# Patient Record
Sex: Female | Born: 1965 | Race: Black or African American | Hispanic: No | Marital: Single | Smoking: Current every day smoker
Health system: Southern US, Community
[De-identification: ages and names within clinical notes are randomized; demographics above are authoritative.]

## PROBLEM LIST (undated history)

## (undated) DIAGNOSIS — I10 Essential (primary) hypertension: Secondary | ICD-10-CM

## (undated) DIAGNOSIS — A64 Unspecified sexually transmitted disease: Secondary | ICD-10-CM

## (undated) DIAGNOSIS — B009 Herpesviral infection, unspecified: Secondary | ICD-10-CM

## (undated) HISTORY — DX: Herpesviral infection, unspecified: B00.9

## (undated) HISTORY — DX: Unspecified sexually transmitted disease: A64

## (undated) HISTORY — PX: FINGER SURGERY: SHX640

## (undated) HISTORY — DX: Essential (primary) hypertension: I10

---

## 1998-10-02 ENCOUNTER — Other Ambulatory Visit: Admission: RE | Admit: 1998-10-02 | Discharge: 1998-10-02 | Payer: Self-pay | Admitting: Obstetrics & Gynecology

## 2000-01-26 ENCOUNTER — Other Ambulatory Visit: Admission: RE | Admit: 2000-01-26 | Discharge: 2000-01-26 | Payer: Self-pay | Admitting: Obstetrics and Gynecology

## 2001-12-04 ENCOUNTER — Other Ambulatory Visit: Admission: RE | Admit: 2001-12-04 | Discharge: 2001-12-04 | Payer: Self-pay | Admitting: Obstetrics and Gynecology

## 2003-06-20 ENCOUNTER — Other Ambulatory Visit: Admission: RE | Admit: 2003-06-20 | Discharge: 2003-06-20 | Payer: Self-pay | Admitting: Gynecology

## 2004-09-09 ENCOUNTER — Other Ambulatory Visit: Admission: RE | Admit: 2004-09-09 | Discharge: 2004-09-09 | Payer: Self-pay | Admitting: Family Medicine

## 2007-04-20 ENCOUNTER — Other Ambulatory Visit: Admission: RE | Admit: 2007-04-20 | Discharge: 2007-04-20 | Payer: Self-pay | Admitting: Family Medicine

## 2008-10-30 ENCOUNTER — Other Ambulatory Visit: Admission: RE | Admit: 2008-10-30 | Discharge: 2008-10-30 | Payer: Self-pay | Admitting: Family Medicine

## 2010-07-28 ENCOUNTER — Other Ambulatory Visit
Admission: RE | Admit: 2010-07-28 | Discharge: 2010-07-28 | Payer: Self-pay | Source: Home / Self Care | Admitting: Family Medicine

## 2010-07-28 ENCOUNTER — Other Ambulatory Visit: Payer: Self-pay

## 2010-09-04 ENCOUNTER — Emergency Department (HOSPITAL_COMMUNITY): Payer: PRIVATE HEALTH INSURANCE

## 2010-09-04 ENCOUNTER — Observation Stay (HOSPITAL_COMMUNITY)
Admission: EM | Admit: 2010-09-04 | Discharge: 2010-09-04 | DRG: 514 | Disposition: A | Discharge status: Home / Self Care | Payer: PRIVATE HEALTH INSURANCE | Attending: Orthopedic Surgery | Admitting: Orthopedic Surgery

## 2010-09-04 DIAGNOSIS — I10 Essential (primary) hypertension: Secondary | ICD-10-CM | POA: Insufficient documentation

## 2010-09-04 DIAGNOSIS — W28XXXA Contact with powered lawn mower, initial encounter: Secondary | ICD-10-CM | POA: Insufficient documentation

## 2010-09-04 DIAGNOSIS — IMO0002 Reserved for concepts with insufficient information to code with codable children: Secondary | ICD-10-CM | POA: Insufficient documentation

## 2010-09-04 DIAGNOSIS — S62639B Displaced fracture of distal phalanx of unspecified finger, initial encounter for open fracture: Principal | ICD-10-CM | POA: Insufficient documentation

## 2010-09-24 NOTE — H&P (Signed)
Rebecca, Oconnor                 ACCOUNT NO.:  [id number]  MEDICAL RECORD NO.:  [id number]           PATIENT TYPE:  I  LOCATION:  2550                         FACILITY:  [facility]  PHYSICIAN:  Madelynn Done, MD  DATE OF BIRTH:  1965-09-12  DATE OF ADMISSION:  09/04/2010 DATE OF DISCHARGE:  09/04/2010                             HISTORY & PHYSICAL   REASON FOR ADMISSION:  Right long and ring finger open distal phalangeal fractures from a lawn mower injury.  BRIEF HISTORY:  Rebecca Oconnor is a 44-year right-hand-dominant female who was at home using a lawn mower, shook her hand beneath the lawn mower and got her right long and ring fingers cut on the blade.  She presented to the emergency department with obvious deformity and injury to the right long and ring fingers.  No previous injury to the hand.  She is not complaining of anything other than pain to the hand.  A digital block had been performed prior to my evaluation. PAST MEDICAL HISTORY:  Hypertension.  ALLERGIES:  No known drug allergies.  SOCIAL HISTORY:  She is a smoker.  She works here in town.  She lives over near [address]  REVIEW OF SYSTEMS:  No recent hospitalizations or illnesses.  PHYSICAL EXAMINATION:  GENERAL:  She is a healthy-appearing female. VITAL SIGNS:  Temperature 98.5, blood pressure 133/78, heart rate of 72, and respirations 18. NEUROLOGIC:  She has good hand coordination of the left hand.  Normal mood.  She is alert and oriented to person, place, and time, in no acute distress. HEENT:  Pupils are equal and round.  Sclerae are white.  Head atraumatic. NECK:  Soft and nontender. CHEST:  Equal chest excursion.  No audible wheezing. CARDIOVASCULAR:  Regular rate. ABDOMEN:  Soft, nontender, and nondistended. EXTREMITIES:  No injuries to the left upper extremity or bilateral lower extremities.  On examination of right upper extremity, the patient does have an open comminuted distal phalanx  fracture with loss of large portion of the nail bed and underlying distal phalanx.  Most of the wound is over the dorsal aspect of the finger.  She also has soft tissue lacerations to the ring finger, but no full-thickness injury or open bone fragments exposed.  FDP and FDS are both in continuity to the long and ring.  She is able to make the okay sign across the fingertips, extend the thumb, and extend the digits.  Her fingertips are warm and skin flaps to both the long and ring fingers look good.  She has good wrist flexion and extension as well as forearm rotation.  Radiographs do show a highly comminuted distal phalanx fracture to the right long finger as well as soft tissue injury to the right ring finger.  IMPRESSION:  Right long and ring finger lawn mower injuries with open distal phalangeal fractures.  PLAN:  Today, the findings were reviewed with the patient.  We talked about the injuries to both the long and ring fingers and after talking with her in detail I recommended operative intervention to help debrideand fix the distal phalangeal injuries,  likely revision and amputation to the long finger and soft tissue repair to the ring finger.  We talked about the risks of surgery to include, but not limited to bleeding, infection, damage to nearby nerves, arteries, or tendons, loss of motion of the elbow and wrist, loss of motion of the digits, and need for further surgical intervention.  All questions were answered and encouraged the patient today.  The patient will taken to the operating room shortly, preoperative antibiotics.  She will be able to go home following the operation, continue to follow as an outpatient.     Madelynn Done, MD     FWO/MEDQ  D:  09/04/2010  T:  09/05/2010  Job:  [id number]  Electronically Signed by Bradly Bienenstock IV MD on 09/24/2010 12:39:50 PM

## 2010-09-24 NOTE — Op Note (Signed)
Rebecca Oconnor, Rebecca Oconnor                 ACCOUNT NO.:  [id number]  MEDICAL RECORD NO.:  [id number]           PATIENT TYPE:  I  LOCATION:  2550                         FACILITY:  [facility]  PHYSICIAN:  Madelynn Done, MD  DATE OF BIRTH:  1965-09-27  DATE OF PROCEDURE:  09/04/2010 DATE OF DISCHARGE:  09/04/2010                              OPERATIVE REPORT   PREOPERATIVE DIAGNOSES: 1. Right ring finger open distal phalanx fracture. 2. Right long finger near distal amputation with comminuted fracture     of the right distal phalanx.  POSTOPERATIVE DIAGNOSES: 1. Right ring finger open distal phalanx fracture. 2. Right long finger near distal amputation with comminuted fracture     of the right distal phalanx.  ATTENDING PHYSICIAN:  Dr. Merlyn Albert, who scrubbed and was present for the entire procedure.  ASSISTANT SURGEON:  None.  ANESTHESIA:  General via LMA.  TOURNIQUET TIME:  Less than 30 minutes at 250 mmHg.  SURGICAL PROCEDURES: 1. Right ring finger open treatment of distal phalanx fracture without     internal fixation. 2. Open debridement of skin, subcutaneous tissue, and bone associated     with open fracture, right ring finger. 3. Repair of right ring finger nail bed. 4. Right long finger open debridement of skin, subcutaneous tissue,     and bone associated with open fracture. 5. Right long finger revision amputation with neurectomies and     advancement flap closure.  SURGICAL INDICATIONS:  Rebecca Oconnor is a 45 year old right-hand dominant female, who placed her hand unfortunately under a running lawn mower. The patient sustained comminuted injuries to the right long and ring fingers.  The patient was seen and evaluated in the emergency department and given the degree of her injury, recommend that she undergo the above procedure.  Risks, benefits, and alternative were discussed in detail with the patient and signed informed consent was obtained.  Risks include but not limited  to bleeding, infection, damage nearby nerves, arteries, tendons, loss of motion of wrist and digits, and need for further surgical intervention.  DESCRIPTION OF PROCEDURE:  The patient was properly identified in the preoperative holding area and marked with permanent marker made on the right long and ring finger just to indicate the correct operative site. The patient was then brought back to the operating room, placed supine on the anesthesia room table where the general anesthetic was administered.  The patient received preoperative antibiotics prior to skin incisions.  The patient tolerated induction of anesthesia.  Well- padded tourniquet was then placed on the right brachium and sealed with 1000 drape.  The right upper extremity was prepped and draped in normal sterile fashion.  Time-out was called, correct site was identified, and procedure was then begun.  Attention was then turned to the right ring finger where excisional debridement of the open fractures was then carried out.  Sharp dissection was used with a skin knife, rongeur, and curette to debride the open fracture site.  Several loose pieces of bone were then excised.  The patient did have a comminuted fracture of the ring finger, distal phalanx, but did not  necessitate internal fixation. Open treatment was carried out, but without internal fixation.  Once this was carried out, the wound was then thoroughly irrigated.  The nail bed was then repaired at the distal tuft with a 5-0 simple chromic sutures.  After repair of the nail bed, the wound was then thoroughly irrigated.  Attention was then turned to the right long finger where open debridement was then carried out in the excisional debridement of the highly contaminated distal phalanx.  The patient did lose almost the entirety of the distal nail bed and portion of the bone.  After excisional debridement was carried out with the knife rongeur, and curettes, copious  irrigation was then done.  Revision amputation through the level of the distal phalanx was then carried out.  The skin flaps were then rotated and advanced and then skin edges were then trimmed. Local neurectomies had been completed.  The wound was then thoroughly irrigated.  The skin flaps were then rotated and closed using simple nylon suture.  A 5 mL of 0.25% Marcaine infiltrated into both fingers. Adaptic dressings were then applied.  Sterile compressive bandage was then applied.  The patient was then extubated and taken to recovery room in good condition.  POSTOPERATIVE PLAN:  The patient discharged home and seen back in the office in approximately 9 days for wound check, and sent down to see her therapist for tip-protector splint and gradually use and activity of the fingers.     Madelynn Done, MD     FWO/MEDQ  D:  09/04/2010  T:  09/05/2010  Job:  [id number]  Electronically Signed by Bradly Bienenstock IV MD on 09/24/2010 12:39:53 PM

## 2010-11-16 NOTE — Discharge Summary (Signed)
  Rebecca Oconnor, Rebecca Oconnor                 ACCOUNT NO.:  [id number]  MEDICAL RECORD NO.:  [id number]           PATIENT TYPE:  O  LOCATION:  2550                         FACILITY:  [facility]  PHYSICIAN:  Madelynn Done, MD  DATE OF BIRTH:  [birth year]  DATE OF ADMISSION:  09/04/2010 DATE OF DISCHARGE:  09/04/2010                              DISCHARGE SUMMARY   HOSPITAL COURSE:  The patient was seen and evaluated in the emergency department.  The patient was then taken to the operating room and then went home following her operative procedure.  The patient was not admitted.  She came to the ER, had the procedure, and then went home. She sustained lawn mower injuries to her fingers.  She was taken to the operating room and underwent repair and reconstruction.  The patient tolerated the procedure well, was similar to an outpatient surgical procedure.  She was not admitted to the hospital.  She went in through the ER, had an operation, and then went home.  She has been followed closely in the office and is doing well.     Madelynn Done, MD     FWO/MEDQ  D:  11/02/2010  T:  11/03/2010  Job:  [id number]  Electronically Signed by Bradly Bienenstock IV MD on 11/16/2010 09:04:53 AM

## 2012-09-26 ENCOUNTER — Ambulatory Visit (INDEPENDENT_AMBULATORY_CARE_PROVIDER_SITE_OTHER): Payer: Commercial Managed Care - PPO | Admitting: Family Medicine

## 2012-09-26 VITALS — BP 122/92 | HR 86 | Temp 98.5°F | Resp 16 | Ht 67.5 in | Wt 202.0 lb

## 2012-09-26 DIAGNOSIS — M25469 Effusion, unspecified knee: Secondary | ICD-10-CM

## 2012-09-26 DIAGNOSIS — M25461 Effusion, right knee: Secondary | ICD-10-CM

## 2012-09-26 MED ORDER — MELOXICAM 15 MG PO TABS: 15.0000 mg | ORAL_TABLET | Freq: Every day | ORAL | Status: DC

## 2012-09-26 MED ORDER — TRIAMCINOLONE ACETONIDE 40 MG/ML IJ SUSP
40.0000 mg | Freq: Once | INTRAMUSCULAR | Status: AC
  Administered 2012-09-26: 40 mg via INTRA_ARTICULAR

## 2012-09-26 NOTE — Patient Instructions (Signed)
Very nice to meet you Keep playing kick ball but take the next week off. Do the exercises I am giving oto you daily for the next 2 weeks then 3 times a week therafter FOREVER If you get worse swelling, any locking or catching but causes significant pain please come back and be evaluated again. I am also giving you meloxicam. You can take one pill daily for the next 10 days and as needed thereafter. Stop this medication if it hurts your stomach.

## 2012-09-26 NOTE — Progress Notes (Signed)
Chief complaint right knee swelling  History of present illness: Patient is a 47 year old woman coming in with a one-week history of a swollen knee. Patient was playing kickball the other day and did not injure it at all but unfortunately the next day started having significant swelling. Patient states that the swelling is very sore. Patient denies any clicking locking or giving out on her. Patient was wearing a brace at that time. The patient has not tried any of modalities for 6 of the swelling did not go way. The patient denies that the pain is waking her up at night. Patient states that she has had swelling of this knee previously. Patient states that for multiple years since this has occurred again.  Past Medical History  Diagnosis Date  . Hypertension     History reviewed. No pertinent past surgical history.  No family history on file.   History  Substance Use Topics  . Smoking status: Current Every Day Smoker  . Smokeless tobacco: Not on file  . Alcohol Use: Not on file   No family history on file.  Physical exam Blood pressure 122/92, pulse 86, temperature 98.5 F (36.9 C), temperature source Oral, resp. rate 16, height 5' 7.5" (1.715 m), weight 202 lb (91.627 kg), SpO2 98.00%. General: No apparent distress alert and oriented x3 mood and affect normal Respiratory: Patient's speak in full sentences and does not appear short of breath Skin: Warm dry intact with no signs of infection or rash Neuro: Cranial nerves II through XII are intact, neurovascularly intact in all extremities with 2+ DTRs and 2+ pulses. Right knee exam: On inspection patient does have effusion of the right knee. She does have full range of motion and is neurovascularly intact distally. Patient's ligaments are all intact but she does have a positive McMurray sign.  After verbal and written consent the patient was prepped with alcohol swabs and did have 3 cc of lidocaine 1% injected in the superior lateral  aspect of the knee with a 23-gauge 1 inch needle she then had an 18-gauge 1-1/2 inch needle placed and it had 20 cc of strawlike colored fluid removed from the knee. Patient then was injected with 40 mg of Kenalog. Patient tolerated the procedure well and then was wrapped in an Ace bandage.  Assessment: Knee effusion likely with medial meniscal tear.  Plan: The patient tolerated the procedure well and did have an amount removed. Patient was told to get a knee compression sleeve and was given recommendations. Patient was given a prescription of oxygen as well. Patient warned of different mechanical symptoms that would need further followup and imaging. Otherwise patient can followup as needed.

## 2012-09-27 NOTE — Progress Notes (Signed)
patient discussed with Dr. Katrinka Blazing. Agree with assessment and plan of care per his note.

## 2012-10-29 ENCOUNTER — Other Ambulatory Visit: Payer: Self-pay | Admitting: Family Medicine

## 2012-10-29 DIAGNOSIS — M25461 Effusion, right knee: Secondary | ICD-10-CM

## 2012-11-01 ENCOUNTER — Other Ambulatory Visit: Payer: PRIVATE HEALTH INSURANCE

## 2013-06-25 ENCOUNTER — Other Ambulatory Visit (HOSPITAL_COMMUNITY)
Admission: RE | Admit: 2013-06-25 | Discharge: 2013-06-25 | Disposition: A | Discharge status: Home / Self Care | Payer: Commercial Managed Care - PPO | Source: Ambulatory Visit | Attending: Family Medicine | Admitting: Family Medicine

## 2013-06-25 ENCOUNTER — Other Ambulatory Visit: Payer: Self-pay | Admitting: Physician Assistant

## 2013-06-25 DIAGNOSIS — Z0000 Encounter for general adult medical examination without abnormal findings: Secondary | ICD-10-CM | POA: Insufficient documentation

## 2013-12-19 ENCOUNTER — Ambulatory Visit
Admission: RE | Admit: 2013-12-19 | Discharge: 2013-12-19 | Disposition: A | Discharge status: Home / Self Care | Payer: Commercial Managed Care - PPO | Source: Ambulatory Visit | Attending: Physician Assistant | Admitting: Physician Assistant

## 2013-12-19 ENCOUNTER — Other Ambulatory Visit: Payer: Self-pay | Admitting: Physician Assistant

## 2013-12-19 DIAGNOSIS — R52 Pain, unspecified: Secondary | ICD-10-CM

## 2013-12-19 DIAGNOSIS — R609 Edema, unspecified: Secondary | ICD-10-CM

## 2014-01-06 ENCOUNTER — Telehealth: Payer: Self-pay | Admitting: *Deleted

## 2014-01-06 NOTE — Telephone Encounter (Signed)
I have an appointment on the 16th.  I'm wondering if I can do something else before then.  My foot is swelling, it started on the top of my foot now it's going up into my ankle.  Should I do heat or ice?   I called and left her a message to do RICE; rest, ice, compression- ace bandage and elevation.  Call if you have any further questions.

## 2014-01-23 ENCOUNTER — Ambulatory Visit: Payer: Commercial Managed Care - PPO | Admitting: Podiatry

## 2017-09-28 ENCOUNTER — Encounter (HOSPITAL_COMMUNITY): Payer: Self-pay | Admitting: Emergency Medicine

## 2017-09-28 ENCOUNTER — Ambulatory Visit (INDEPENDENT_AMBULATORY_CARE_PROVIDER_SITE_OTHER): Payer: Managed Care, Other (non HMO)

## 2017-09-28 ENCOUNTER — Other Ambulatory Visit: Payer: Self-pay

## 2017-09-28 ENCOUNTER — Ambulatory Visit (HOSPITAL_COMMUNITY)
Admission: EM | Admit: 2017-09-28 | Discharge: 2017-09-28 | Disposition: A | Discharge status: Home / Self Care | Payer: Managed Care, Other (non HMO) | Attending: Family Medicine | Admitting: Family Medicine

## 2017-09-28 DIAGNOSIS — J069 Acute upper respiratory infection, unspecified: Secondary | ICD-10-CM

## 2017-09-28 DIAGNOSIS — B9789 Other viral agents as the cause of diseases classified elsewhere: Secondary | ICD-10-CM

## 2017-09-28 MED ORDER — HYDROCODONE-HOMATROPINE 5-1.5 MG/5ML PO SYRP: 5.0000 mL | ORAL_SOLUTION | Freq: Four times a day (QID) | ORAL | 0 refills | Status: DC | PRN

## 2017-09-28 NOTE — ED Triage Notes (Signed)
C/o productive cough with green mucus and left side pain onset one week with hoarseness

## 2017-09-28 NOTE — ED Provider Notes (Signed)
[facility]   [id number] 09/28/17 Arrival Time: 1002  ASSESSMENT & PLAN:  1. Viral URI with cough    CXR normal.  Cough medication sedation precautions. Discussed typical duration of symptoms. OTC symptom care as needed. Ensure adequate fluid intake and rest. May f/u with PCP or here as needed.  Reviewed expectations re: course of current medical issues. Questions answered. Outlined signs and symptoms indicating need for more acute intervention. Patient verbalized understanding. After Visit Summary given.   SUBJECTIVE: History from: patient.  Rebecca Oconnor is a 52 y.o. female who presents with complaint of nasal congestion, post-nasal drainage, and a persistent dry cough. Onset abrupt, approximately 5 days ago. Overall fatigued with body aches. SOB: none. Wheezing: none. Fever: unsure, some chills. Overall normal PO intake without n/v. Sick contacts: no. OTC treatment: Mucinex with mild help. Now with L-sided rib discomfort, especially when coughing.  Social History   Tobacco Use  Smoking Status Current Every Day Smoker    ROS: As per HPI.   OBJECTIVE:  Vitals:   09/28/17 1035  BP: 137/85  Pulse: (!) 106  Resp: 16  Temp: 98.3 F (36.8 C)  TempSrc: Oral  SpO2: 98%     General appearance: alert; appears fatigued HEENT: nasal congestion; clear runny nose; throat irritation secondary to post-nasal drainage Neck: supple without LAD Lungs: unlabored respirations, symmetrical air entry; cough: moderate; no respiratory distress Chest wall: she is a little tender over L ribs; poorly localized Skin: warm and dry Psychological: alert and cooperative; normal mood and affect  Imaging: Dg Chest 2 View  Result Date: 09/28/2017 CLINICAL DATA:  Productive cough.  Left-sided rib pain. EXAM: CHEST - 2 VIEW COMPARISON:  None. FINDINGS: The heart size and mediastinal contours are within normal limits. Both lungs are clear. The visualized skeletal structures are  unremarkable. IMPRESSION: Normal exam. Electronically Signed   By: Francene Boyers M.D.   On: 09/28/2017 11:34    No Known Allergies  Past Medical History:  Diagnosis Date  . Hypertension    No family history on file. Social History   Socioeconomic History  . Marital status: Single    Spouse name: Not on file  . Number of children: Not on file  . Years of education: Not on file  . Highest education level: Not on file  Occupational History  . Not on file  Social Needs  . Financial resource strain: Not on file  . Food insecurity:    Worry: Not on file    Inability: Not on file  . Transportation needs:    Medical: Not on file    Non-medical: Not on file  Tobacco Use  . Smoking status: Current Every Day Smoker  Substance and Sexual Activity  . Alcohol use: Not on file  . Drug use: Not on file  . Sexual activity: Not on file  Lifestyle  . Physical activity:    Days per week: Not on file    Minutes per session: Not on file  . Stress: Not on file  Relationships  . Social connections:    Talks on phone: Not on file    Gets together: Not on file    Attends religious service: Not on file    Active member of club or organization: Not on file    Attends meetings of clubs or organizations: Not on file    Relationship status: Not on file  . Intimate partner violence:    Fear of current or ex partner: Not on  file    Emotionally abused: Not on file    Physically abused: Not on file    Forced sexual activity: Not on file  Other Topics Concern  . Not on file  Social History Narrative  . Not on file           Mardella LaymanHagler, Aviv Rota, MD 09/28/17 1145

## 2017-09-28 NOTE — Discharge Instructions (Signed)
Be aware, your cough medication may cause drowsiness. Please do not drive, operate heavy machinery or make important decisions while on this medication, it can cloud your judgement.  

## 2018-06-04 ENCOUNTER — Encounter: Payer: Self-pay | Admitting: Certified Nurse Midwife

## 2018-06-14 ENCOUNTER — Encounter: Payer: 59 | Admitting: Certified Nurse Midwife

## 2018-06-20 ENCOUNTER — Other Ambulatory Visit: Payer: Self-pay

## 2018-06-20 ENCOUNTER — Other Ambulatory Visit (HOSPITAL_COMMUNITY)
Admission: RE | Admit: 2018-06-20 | Discharge: 2018-06-20 | Disposition: A | Discharge status: Home / Self Care | Payer: Managed Care, Other (non HMO) | Source: Ambulatory Visit | Attending: Certified Nurse Midwife | Admitting: Certified Nurse Midwife

## 2018-06-20 ENCOUNTER — Ambulatory Visit: Payer: Managed Care, Other (non HMO) | Admitting: Certified Nurse Midwife

## 2018-06-20 ENCOUNTER — Encounter: Payer: Self-pay | Admitting: Certified Nurse Midwife

## 2018-06-20 VITALS — BP 122/80 | HR 68 | Resp 16 | Ht 66.25 in | Wt 157.0 lb

## 2018-06-20 DIAGNOSIS — Z113 Encounter for screening for infections with a predominantly sexual mode of transmission: Secondary | ICD-10-CM | POA: Diagnosis not present

## 2018-06-20 DIAGNOSIS — Z3202 Encounter for pregnancy test, result negative: Secondary | ICD-10-CM

## 2018-06-20 DIAGNOSIS — Z01419 Encounter for gynecological examination (general) (routine) without abnormal findings: Secondary | ICD-10-CM | POA: Diagnosis not present

## 2018-06-20 DIAGNOSIS — Z124 Encounter for screening for malignant neoplasm of cervix: Secondary | ICD-10-CM | POA: Insufficient documentation

## 2018-06-20 DIAGNOSIS — N912 Amenorrhea, unspecified: Secondary | ICD-10-CM | POA: Diagnosis not present

## 2018-06-20 DIAGNOSIS — R300 Dysuria: Secondary | ICD-10-CM

## 2018-06-20 DIAGNOSIS — Z1211 Encounter for screening for malignant neoplasm of colon: Secondary | ICD-10-CM

## 2018-06-20 DIAGNOSIS — R5383 Other fatigue: Secondary | ICD-10-CM

## 2018-06-20 DIAGNOSIS — Z8679 Personal history of other diseases of the circulatory system: Secondary | ICD-10-CM

## 2018-06-20 DIAGNOSIS — Z862 Personal history of diseases of the blood and blood-forming organs and certain disorders involving the immune mechanism: Secondary | ICD-10-CM

## 2018-06-20 LAB — POCT URINALYSIS DIPSTICK
Bilirubin, UA: NEGATIVE
GLUCOSE UA: NEGATIVE
Ketones, UA: NEGATIVE
NITRITE UA: NEGATIVE
PH UA: 5 (ref 5.0–8.0)
Protein, UA: NEGATIVE
RBC UA: NEGATIVE
Urobilinogen, UA: NEGATIVE E.U./dL — AB

## 2018-06-20 LAB — POCT URINE PREGNANCY: Preg Test, Ur: NEGATIVE

## 2018-06-20 NOTE — Progress Notes (Signed)
52 y.o. G0P0000 Single  African American Fe here to establish gyn care and for  annual exam. Contraception condoms, previous Depo Provera use with last injection ?  two years ago and has had no vaginal bleeding since last Depo. Patient has been having hot flashes occasional, night sweats also. Having vaginal itching at times, but no increase in discharge. " Just does not feel right". Desires STD screening vaginal and blood work also. Was seeing [facility] for hypertension, cholesterol and Prilosec management, but did not keep appointments. She is looking for new PCP now. Has up to date Rx at present. No mammogram in several years and needs information to schedule. Patient has noted brown urine,occasional burning with urination, but no frequency or urgency.  Drinks green tea soda and limited water. Denies fever, chills or back pain. No other concerns today.  No LMP recorded. (Menstrual status: Other).          Sexually active: Yes.    The current method of family planning is none.    Exercising: Yes.    walk Smoker:  yes  Review of Systems  Constitutional: Negative.   HENT: Negative.   Eyes: Negative.   Respiratory: Negative.   Cardiovascular: Negative.   Gastrointestinal:       Stomach feels numb  Genitourinary:       Hot flashes, decreased appetite  Musculoskeletal: Negative.   Skin:       Breast tenderness  Neurological: Negative.   Endo/Heme/Allergies: Negative.   Psychiatric/Behavioral: Negative.     Health Maintenance: Pap:  81yrs ago History of Abnormal Pap: no MMG:  Last year Self Breast exams: yes Colonoscopy:  none BMD:   65yrs ago TDaP: about 57yrs ago  Shingles: no Pneumonia: no Hep C and HIV: not done Labs: poct upt-neg   reports that she has been smoking. She has never used smokeless tobacco. She reports that she drinks about 2.0 standard drinks of alcohol per week. She reports that she has current or past drug history.  Past Medical History:  Diagnosis Date  .  Hypertension     Past Surgical History:  Procedure Laterality Date  . FINGER SURGERY     partial amputation    Current Outpatient Medications  Medication Sig Dispense Refill  . amLODipine (NORVASC) 5 MG tablet TK 1 T PO QD  0  . Cyanocobalamin (B-12 PO) Take by mouth.    . Multiple Vitamins-Minerals (MULTIVITAMIN PO) Take by mouth.    Marland Kitchen omeprazole (PRILOSEC) 20 MG capsule Take 20 mg by mouth daily.    . valsartan-hydrochlorothiazide (DIOVAN-HCT) 320-25 MG tablet Take 1 tablet by mouth daily.    Marland Kitchen aspirin EC 81 MG tablet Take 81 mg by mouth daily.     No current facility-administered medications for this visit.     History reviewed. No pertinent family history.  ROS:  Pertinent items are noted in HPI.  Otherwise, a comprehensive ROS was negative.  Exam:   BP 122/80   Pulse 68   Resp 16   Ht 5' 6.25" (1.683 m)   Wt 157 lb (71.2 kg)   BMI 25.15 kg/m  Height: 5' 6.25" (168.3 cm) Ht Readings from Last 3 Encounters:  06/20/18 5' 6.25" (1.683 m)  09/26/12 5' 7.5" (1.715 m)    General appearance: alert, cooperative and appears stated age Head: Normocephalic, without obvious abnormality, atraumatic Neck: no adenopathy, supple, symmetrical, trachea midline and thyroid normal to inspection and palpation Lungs: clear to auscultation bilaterally Breasts: normal appearance, no  masses or tenderness, No nipple retraction or dimpling, No nipple discharge or bleeding, No axillary or supraclavicular adenopathy Heart: regular rate and rhythm Abdomen: soft, non-tender; no masses,  no organomegaly Extremities: extremities normal, atraumatic, no cyanosis or edema Skin: Skin color, texture, turgor normal. No rashes or lesions Lymph nodes: Cervical, supraclavicular, and axillary nodes normal. No abnormal inguinal nodes palpated Neurologic: Grossly normal   Pelvic: External genitalia:  no lesions              Urethra:  normal appearing urethra with no masses, tenderness or lesions               Bartholin's and Skene's: normal                 Vagina: normal appearing vagina with normal color and discharge, no lesions              Cervix: no cervical motion tenderness, no lesions and normal appearance              Pap taken: Yes.   Bimanual Exam:  Uterus:  normal size, contour, position, consistency, mobility, non-tender and anteverted  Tilts to left              Adnexa: normal adnexa and no mass, fullness, tenderness               Rectovaginal: Confirms               Anus:  normal sphincter tone, no lesions  Chaperone present: yes  A:  Well Woman with normal exam  Contraception previous Depo Provera last one two years ago, condoms now   Amenorrhea negative UPT  Perimenopausal symptoms  R/O UTI suspect dehydration  STD screening  Colonoscopy due  Immunization up date declines today TDAP  History of hypertension/cholesterol with PCP management  History of anemia  P:   Reviewed health and wellness pertinent to exam  Discussed normal pelvic exam finding and with negative pregnancy test and her symptoms suspect perimenopausal. Will do labs to assess and may need Provera challenge due to prolonged amenorrhea. Questions addressed.  Labs: TSH, Prolactin, FSH  Discussed importance of adequate water intake when taking hypertension medication. Urine should look clear. Warning signs of UTI given and need to advise.  ZOX:WRUEALab:Urine culture  Labs: STD panel, Hep C, GC/Chlmaydia, Affirm  Discussed risks/benefits of colonoscopy and recommendation at 52 to screen . Would like referral to schedule. Patient will be called with information.  Mammogram information given.  Stressed importance of establishing with PCP again regarding medication management.  Will call if she needs help with this.  Lab: CBC  Pap smear: yes   counseled on breast self exam, mammography screening, STD prevention, HIV risk factors and prevention, feminine hygiene, adequate intake of calcium and vitamin D, diet and  exercise  return annually or prn  An After Visit Summary was printed and given to the patient.

## 2018-06-20 NOTE — Patient Instructions (Signed)
EXERCISE AND DIET:  We recommended that you start or continue a regular exercise program for good health. Regular exercise means any activity that makes your heart beat faster and makes you sweat.  We recommend exercising at least 30 minutes per day at least 3 days a week, preferably 4 or 5.  We also recommend a diet low in fat and sugar.  Inactivity, poor dietary choices and obesity can cause diabetes, heart attack, stroke, and kidney damage, among others.    ALCOHOL AND SMOKING:  Women should limit their alcohol intake to no more than 7 drinks/beers/glasses of wine (combined, not each!) per week. Moderation of alcohol intake to this level decreases your risk of breast cancer and liver damage. And of course, no recreational drugs are part of a healthy lifestyle.  And absolutely no smoking or even second hand smoke. Most people know smoking can cause heart and lung diseases, but did you know it also contributes to weakening of your bones? Aging of your skin?  Yellowing of your teeth and nails?  CALCIUM AND VITAMIN D:  Adequate intake of calcium and Vitamin D are recommended.  The recommendations for exact amounts of these supplements seem to change often, but generally speaking 600 mg of calcium (either carbonate or citrate) and 800 units of Vitamin D per day seems prudent. Certain women may benefit from higher intake of Vitamin D.  If you are among these women, your doctor will have told you during your visit.    PAP SMEARS:  Pap smears, to check for cervical cancer or precancers,  have traditionally been done yearly, although recent scientific advances have shown that most women can have pap smears less often.  However, every woman still should have a physical exam from her gynecologist every year. It will include a breast check, inspection of the vulva and vagina to check for abnormal growths or skin changes, a visual exam of the cervix, and then an exam to evaluate the size and shape of the uterus and  ovaries.  And after 50 years of age, a rectal exam is indicated to check for rectal cancers. We will also provide age appropriate advice regarding health maintenance, like when you should have certain vaccines, screening for sexually transmitted diseases, bone density testing, colonoscopy, mammograms, etc.   MAMMOGRAMS:  All women over 40 years old should have a yearly mammogram. Many facilities now offer a "3D" mammogram, which may cost around $50 extra out of pocket. If possible,  we recommend you accept the option to have the 3D mammogram performed.  It both reduces the number of women who will be called back for extra views which then turn out to be normal, and it is better than the routine mammogram at detecting truly abnormal areas.    COLONOSCOPY:  Colonoscopy to screen for colon cancer is recommended for all women at age 50.  We know, you hate the idea of the prep.  We agree, BUT, having colon cancer and not knowing it is worse!!  Colon cancer so often starts as a polyp that can be seen and removed at colonscopy, which can quite literally save your life!  And if your first colonoscopy is normal and you have no family history of colon cancer, most women don't have to have it again for 10 years.  Once every ten years, you can do something that may end up saving your life, right?  We will be happy to help you get it scheduled when you are ready.    Be sure to check your insurance coverage so you understand how much it will cost.  It may be covered as a preventative service at no cost, but you should check your particular policy.     Perimenopause Perimenopause is the time when your body begins to move into the menopause (no menstrual period for 12 straight months). It is a natural process. Perimenopause can begin 2-8 years before the menopause and usually lasts for 1 year after the menopause. During this time, your ovaries may or may not produce an egg. The ovaries vary in their production of estrogen and  progesterone hormones each month. This can cause irregular menstrual periods, difficulty getting pregnant, vaginal bleeding between periods, and uncomfortable symptoms. What are the causes?  Irregular production of the ovarian hormones, estrogen and progesterone, and not ovulating every month. Other causes include:  Tumor of the pituitary gland in the brain.  Medical disease that affects the ovaries.  Radiation treatment.  Chemotherapy.  Unknown causes.  Heavy smoking and excessive alcohol intake can bring on perimenopause sooner.  What are the signs or symptoms?  Hot flashes.  Night sweats.  Irregular menstrual periods.  Decreased sex drive.  Vaginal dryness.  Headaches.  Mood swings.  Depression.  Memory problems.  Irritability.  Tiredness.  Weight gain.  Trouble getting pregnant.  The beginning of losing bone cells (osteoporosis).  The beginning of hardening of the arteries (atherosclerosis). How is this diagnosed? Your health care provider will make a diagnosis by analyzing your age, menstrual history, and symptoms. He or she will do a physical exam and note any changes in your body, especially your female organs. Female hormone tests may or may not be helpful depending on the amount of female hormones you produce and when you produce them. However, other hormone tests may be helpful to rule out other problems. How is this treated? In some cases, no treatment is needed. The decision on whether treatment is necessary during the perimenopause should be made by you and your health care provider based on how the symptoms are affecting you and your lifestyle. Various treatments are available, such as:  Treating individual symptoms with a specific medicine for that symptom.  Herbal medicines that can help specific symptoms.  Counseling.  Group therapy.  Follow these instructions at home:  Keep track of your menstrual periods (when they occur, how heavy  they are, how long between periods, and how long they last) as well as your symptoms and when they started.  Only take over-the-counter or prescription medicines as directed by your health care provider.  Sleep and rest.  Exercise.  Eat a diet that contains calcium (good for your bones) and soy (acts like the estrogen hormone).  Do not smoke.  Avoid alcoholic beverages.  Take vitamin supplements as recommended by your health care provider. Taking vitamin E may help in certain cases.  Take calcium and vitamin D supplements to help prevent bone loss.  Group therapy is sometimes helpful.  Acupuncture may help in some cases. Contact a health care provider if:  You have questions about any symptoms you are having.  You need a referral to a specialist (gynecologist, psychiatrist, or psychologist). Get help right away if:  You have vaginal bleeding.  Your period lasts longer than 8 days.  Your periods are recurring sooner than 21 days.  You have bleeding after intercourse.  You have severe depression.  You have pain when you urinate.  You have severe headaches.  You have vision   problems. This information is not intended to replace advice given to you by your health care provider. Make sure you discuss any questions you have with your health care provider. Document Released: 08/04/2004 Document Revised: 12/03/2015 Document Reviewed: 01/24/2013 Elsevier Interactive Patient Education  2017 [employer]  

## 2018-06-21 ENCOUNTER — Other Ambulatory Visit: Payer: Self-pay | Admitting: *Deleted

## 2018-06-21 ENCOUNTER — Telehealth: Payer: Self-pay | Admitting: Emergency Medicine

## 2018-06-21 LAB — HEP, RPR, HIV PANEL
HIV Screen 4th Generation wRfx: NONREACTIVE
Hepatitis B Surface Ag: NEGATIVE
RPR: NONREACTIVE

## 2018-06-21 LAB — CBC
HEMATOCRIT: 32.9 % — AB (ref 34.0–46.6)
HEMOGLOBIN: 12.1 g/dL (ref 11.1–15.9)
MCH: 31.7 pg (ref 26.6–33.0)
MCHC: 36.8 g/dL — ABNORMAL HIGH (ref 31.5–35.7)
MCV: 86 fL (ref 79–97)
PLATELETS: 320 10*3/uL (ref 150–450)
RBC: 3.82 x10E6/uL (ref 3.77–5.28)
RDW: 12.2 % — ABNORMAL LOW (ref 12.3–15.4)
WBC: 6.4 10*3/uL (ref 3.4–10.8)

## 2018-06-21 LAB — VITAMIN D 25 HYDROXY (VIT D DEFICIENCY, FRACTURES): Vit D, 25-Hydroxy: 5.2 ng/mL — ABNORMAL LOW (ref 30.0–100.0)

## 2018-06-21 LAB — TSH: TSH: 1.37 u[IU]/mL (ref 0.450–4.500)

## 2018-06-21 LAB — VAGINITIS/VAGINOSIS, DNA PROBE
CANDIDA SPECIES: NEGATIVE
GARDNERELLA VAGINALIS: NEGATIVE
Trichomonas vaginosis: POSITIVE — AB

## 2018-06-21 LAB — PROLACTIN: Prolactin: 9.9 ng/mL (ref 4.8–23.3)

## 2018-06-21 LAB — FOLLICLE STIMULATING HORMONE: FSH: 65.2 m[IU]/mL

## 2018-06-21 LAB — HEPATITIS C ANTIBODY

## 2018-06-21 MED ORDER — VITAMIN D (ERGOCALCIFEROL) 1.25 MG (50000 UNIT) PO CAPS: 50000.0000 [IU] | ORAL_CAPSULE | ORAL | 0 refills | Status: DC

## 2018-06-21 MED ORDER — METRONIDAZOLE 500 MG PO TABS: 500.0000 mg | ORAL_TABLET | Freq: Two times a day (BID) | ORAL | 0 refills | Status: DC

## 2018-06-21 NOTE — Telephone Encounter (Signed)
-----   Message from Verner Choleborah S Leonard, CNM sent at 06/21/2018 12:05 PM EST ----- Notify patient her vaginal screen was negative for yeast and BV, but positive for trichomonas which is sexually transmitted, so partner will treatment. This is why she was having some change in discharge. She will need Rx Flagyl 500 mg bid x 7 give alcohol precautions and schedule TOC Vitamin D is extremely low which will increase fatigue and decrease calcium absorption, needs Rx 50,000 once every 7 days x 3 months and recheck lab, work on food sources also TSH is normal CBC is essentially normal with no anemia, FSH indicates menopausal range, so the hot flashes she is having are related to this. If she has any bleeding she needs to call and come in Prolactin level is normal Hep B,C, HIV, RPR are negative Remainder of lab is pending

## 2018-06-22 LAB — GC/CHLAMYDIA PROBE AMP
Chlamydia trachomatis, NAA: NEGATIVE
Neisseria gonorrhoeae by PCR: NEGATIVE

## 2018-06-23 LAB — URINE CULTURE

## 2018-06-25 ENCOUNTER — Telehealth: Payer: Self-pay | Admitting: Emergency Medicine

## 2018-06-25 LAB — CYTOLOGY - PAP
Diagnosis: NEGATIVE
HPV (WINDOPATH): NOT DETECTED

## 2018-06-25 MED ORDER — NITROFURANTOIN MONOHYD MACRO 100 MG PO CAPS: 100.0000 mg | ORAL_CAPSULE | Freq: Two times a day (BID) | ORAL | 0 refills | Status: DC

## 2018-06-25 NOTE — Telephone Encounter (Signed)
-----   Message from Verner Choleborah S Leonard, CNM sent at 06/24/2018  7:14 AM EST ----- Notify patient her urine culture showed Enterococcus. She will need Rx Macrobid 100 mg BID x 7 days   Take with food. She must!!!! Drink adequate water 6-8  8 oz glasses of water while treating to flush out bacteria, she also is on a diuretic for hypertension.  So very important.

## 2018-06-25 NOTE — Telephone Encounter (Signed)
Spoke with patient and message from Leota Sauerseborah Leonard CNM given.  Verbalized understanding of results.  Macrobid instructions given.  Will follow up as scheduled for test of cure trich on 07/19/17.

## 2018-07-12 ENCOUNTER — Other Ambulatory Visit: Payer: Self-pay

## 2018-07-12 ENCOUNTER — Ambulatory Visit: Payer: Managed Care, Other (non HMO) | Admitting: Certified Nurse Midwife

## 2018-07-12 ENCOUNTER — Encounter: Payer: Self-pay | Admitting: Certified Nurse Midwife

## 2018-07-12 VITALS — BP 110/70 | HR 68 | Resp 16 | Wt 156.0 lb

## 2018-07-12 DIAGNOSIS — B009 Herpesviral infection, unspecified: Secondary | ICD-10-CM | POA: Diagnosis not present

## 2018-07-12 DIAGNOSIS — N9489 Other specified conditions associated with female genital organs and menstrual cycle: Secondary | ICD-10-CM

## 2018-07-12 DIAGNOSIS — N949 Unspecified condition associated with female genital organs and menstrual cycle: Secondary | ICD-10-CM | POA: Diagnosis not present

## 2018-07-12 DIAGNOSIS — R350 Frequency of micturition: Secondary | ICD-10-CM | POA: Diagnosis not present

## 2018-07-12 MED ORDER — VALACYCLOVIR HCL 500 MG PO TABS: ORAL_TABLET | ORAL | 0 refills | Status: DC

## 2018-07-12 NOTE — Progress Notes (Signed)
53 y.o. Single African American female [id number] here with complaint of vaginal symptoms of itching, burning and stinging, and increase discharge. Describes discharge as "white stuff", no odor that she is aware of. Patient feels more burning on the outside at bottom of vagina. Also check to see if hemorrhoid present. "I can feel a bump near my rectum" Denies vaginal bleeding, rectal bleeding or constipation. Patient was treated approximately 3 weeks ago for UTI and Trichomonas. No partner change since last visit. Denies fever, chills or back ache. No new personal products. . Onset of symptoms 3-4 days ago. No  STD concerns, screened for at last visit. Contraception: Menopausal . No other health issues today.  . Review of Systems  Constitutional: Negative.   HENT: Negative.   Eyes: Negative.   Respiratory: Negative.   Cardiovascular: Negative.   Gastrointestinal: Negative.   Genitourinary: Negative.   Musculoskeletal: Negative.   Skin:       Rectal burning, vaginal discharge & itching  Neurological: Negative.   Endo/Heme/Allergies: Negative.   Psychiatric/Behavioral: Negative.     O:Healthy female WDWN Affect: normal, orientation x 3  Exam:Skin: warm and dry CVAT bilateral negative Abdomen:soft, non tender  Inguinal Lymph nodes: no enlargement or tenderness Pelvic exam: External genital: normal female, with blisters noted on perineal area at fourchette with broken skin center of fourchette and area of burning. 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 Patient history of HSV. BUS: negative Bladder, urethra and urethral meatus non tender Vagina: scant white slightly discharge noted.   Affirm taken Cervix: normal, non tender, no CMT Uterus: normal, non tender Adnexa:normal, non tender, no masses or fullness noted Rectal area: no redness, small hemorrhoid tag noted, no internal hemorrhoids palpated   A:Normal pelvic exam HSV 2 history with outbreak R/O vaginal  infection and trichomonas test of cure R/O UTI due to symptom history , suspect HSV related   P:Discussed findings of HSV 2  and etiology. Discussed Aveeno or baking soda sitz bath for comfort.This will help with burning and discomfort with outbreak. Aware can be sexually transmitted. CM:KLKJZPH 500 mg bid x 7 days due to appearance. See Rx with instructions for reoccurrence treatment.  Lab: Affirm Lab: Urine culture Warning signs of UTI given and need to advise if occurs. Questions addressed.  Rv prn Rv prn

## 2018-07-12 NOTE — Patient Instructions (Signed)
Genital Herpes °Genital herpes is a common sexually transmitted infection (STI) that is caused by a virus. The virus spreads from person to person through sexual contact. Infection can cause itching, blisters, and sores around the genitals or rectum. Symptoms may last several days and then go away This is called an outbreak. However, the virus remains in your body, so you may have more outbreaks in the future. The time between outbreaks varies and can be months or years. °Genital herpes affects men and women. It is particularly concerning for pregnant women because the virus can be passed to the baby during delivery and can cause serious problems. Genital herpes is also a concern for people who have a weak disease-fighting (immune) system. °What are the causes? °This condition is caused by the herpes simplex virus (HSV) type 1 or type 2. The virus may spread through: °· Sexual contact with an infected person, including vaginal, anal, and oral sex. °· Contact with fluid from a herpes sore. °· The skin. This means that you can get herpes from an infected partner even if he or she does not have a visible sore or does not know that he or she is infected. °What increases the risk? °You are more likely to develop this condition if: °· You have sex with many partners. °· You do not use latex condoms during sex. °What are the signs or symptoms? °Most people do not have symptoms (asymptomatic) or have mild symptoms that may be mistaken for other skin problems. Symptoms may include: °· Small red bumps near the genitals, rectum, or mouth. These bumps turn into blisters and then turn into sores. °· Flu-like symptoms, including: °? Fever. °? Body aches. °? Swollen lymph nodes. °? Headache. °· Painful urination. °· Pain and itching in the genital area or rectal area. °· Vaginal discharge. °· Tingling or shooting pain in the legs and buttocks. °Generally, symptoms are more severe and last longer during the first (primary)  outbreak. Flu-like symptoms are also more common during the primary outbreak. °How is this diagnosed? °Genital herpes may be diagnosed based on: °· A physical exam. °· Your medical history. °· Blood tests. °· A test of a fluid sample (culture) from an open sore. °How is this treated? °There is no cure for this condition, but treatment with antiviral medicines that are taken by mouth (orally) can do the following: °· Speed up healing and relieve symptoms. °· Help to reduce the spread of the virus to sexual partners. °· Limit the chance of future outbreaks, or make future outbreaks shorter. °· Lessen symptoms of future outbreaks. °Your health care provider may also recommend pain relief medicines, such as aspirin or ibuprofen. °Follow these instructions at home: °Sexual activity °· Do not have sexual contact during active outbreaks. °· Practice safe sex. Latex condoms and female condoms may help prevent the spread of the herpes virus. °General instructions °· Keep the affected areas dry and clean. °· Take over-the-counter and prescription medicines only as told by your health care provider. °· Avoid rubbing or touching blisters and sores. If you do touch blisters or sores: °? Wash your hands thoroughly with soap and water. °? Do not touch your eyes afterward. °· To help relieve pain or itching, you may take the following actions as directed by your health care provider: °? Apply a cold, wet cloth (cold compress) to affected areas 4-6 times a day. °? Apply a substance that protects your skin and reduces bleeding (astringent). °? Apply a gel that   helps relieve pain around sores (lidocaine gel). °? Take a warm, shallow bath that cleans the genital area (sitz bath). °· Keep all follow-up visits as told by your health care provider. This is important. °How is this prevented? °· Use condoms. Although anyone can get genital herpes during sexual contact, even with the use of a condom, a condom can provide some  protection. °· Avoid having multiple sexual partners. °· Talk with your sexual partner about any symptoms either of you may have. Also, talk with your partner about any history of STIs. °· Get tested for STIs before you have sex. Ask your partner to do the same. °· Do not have sexual contact if you have symptoms of genital herpes. °Contact a health care provider if: °· Your symptoms are not improving with medicine. °· Your symptoms return. °· You have new symptoms. °· You have a fever. °· You have abdominal pain. °· You have redness, swelling, or pain in your eye. °· You notice new sores on other parts of your body. °· You are a woman and experience bleeding between menstrual periods. °· You have had herpes and you become pregnant or plan to become pregnant. °Summary °· Genital herpes is a common sexually transmitted infection (STI) that is caused by the herpes simplex virus (HSV) type 1 or type 2. °· These viruses are most often spread through sexual contact with an infected person. °· You are more likely to develop this condition if you have sex with many partners or you have unprotected sex. °· Most people do not have symptoms (asymptomatic) or have mild symptoms that may be mistaken for other skin problems. Symptoms occur as outbreaks that may happen months or years apart. °· There is no cure for this condition, but treatment with oral antiviral medicines can reduce symptoms, reduce the chance of spreading the virus to a partner, prevent future outbreaks, or shorten future outbreaks. °This information is not intended to replace advice given to you by your health care provider. Make sure you discuss any questions you have with your health care provider. °Document Released: 06/24/2000 Document Revised: 05/27/2016 Document Reviewed: 05/27/2016 °Elsevier Interactive Patient Education © 2019 [employer] ° °

## 2018-07-13 ENCOUNTER — Ambulatory Visit: Payer: Managed Care, Other (non HMO)

## 2018-07-13 LAB — VAGINITIS/VAGINOSIS, DNA PROBE
Candida Species: NEGATIVE
GARDNERELLA VAGINALIS: POSITIVE — AB
Trichomonas vaginosis: NEGATIVE

## 2018-07-13 NOTE — Telephone Encounter (Signed)
Patient notified of results. See result note dated 07-13-18.

## 2018-07-13 NOTE — Progress Notes (Deleted)
Patient here to provide urine sample for UC. CC urine sent for culture. Patient advised will be updated when results received and reviewed by Leota Sauers, CNM. Patient agreeable. Patient advised of affirm results from 07-12-18, see result note. Patient advised to complete all medication as prescribed and agreeable.

## 2018-07-13 NOTE — Telephone Encounter (Signed)
Patient canceled her urine culture appointment this afternoon due to illness. She has a TOC 07/19/18 and is asking if she could have this done at this appointment?

## 2018-07-13 NOTE — Telephone Encounter (Signed)
-----   Message from Verner Chol, CNM sent at 07/13/2018  7:53 AM EST ----- Notify patient her vaginal screen was negative for yeast and Trichomonas( so this has cleared), but positive for BV which is seen with HSV 2 outbreaks commonly. This may clear with  No treatment once HSV2 has resolved. She did not complete all her other treatment for trichomonas, so would complete this and if vaginal odor still present please call

## 2018-07-17 ENCOUNTER — Other Ambulatory Visit: Payer: Self-pay

## 2018-07-17 ENCOUNTER — Ambulatory Visit (HOSPITAL_COMMUNITY)
Admission: EM | Admit: 2018-07-17 | Discharge: 2018-07-17 | Disposition: A | Discharge status: Home / Self Care | Payer: Managed Care, Other (non HMO) | Attending: Family Medicine | Admitting: Family Medicine

## 2018-07-17 ENCOUNTER — Encounter (HOSPITAL_COMMUNITY): Payer: Self-pay | Admitting: Emergency Medicine

## 2018-07-17 DIAGNOSIS — R05 Cough: Secondary | ICD-10-CM | POA: Diagnosis not present

## 2018-07-17 DIAGNOSIS — J069 Acute upper respiratory infection, unspecified: Secondary | ICD-10-CM | POA: Insufficient documentation

## 2018-07-17 DIAGNOSIS — R200 Anesthesia of skin: Secondary | ICD-10-CM | POA: Insufficient documentation

## 2018-07-17 DIAGNOSIS — B9789 Other viral agents as the cause of diseases classified elsewhere: Secondary | ICD-10-CM | POA: Insufficient documentation

## 2018-07-17 LAB — POCT I-STAT, CHEM 8
BUN: 5 mg/dL — ABNORMAL LOW (ref 6–20)
CHLORIDE: 99 mmol/L (ref 98–111)
Calcium, Ion: 1.2 mmol/L (ref 1.15–1.40)
Creatinine, Ser: 0.7 mg/dL (ref 0.44–1.00)
Glucose, Bld: 79 mg/dL (ref 70–99)
HCT: 37 % (ref 36.0–46.0)
Hemoglobin: 12.6 g/dL (ref 12.0–15.0)
POTASSIUM: 4.1 mmol/L (ref 3.5–5.1)
SODIUM: 134 mmol/L — AB (ref 135–145)
TCO2: 23 mmol/L (ref 22–32)

## 2018-07-17 MED ORDER — BENZONATATE 200 MG PO CAPS: 200.0000 mg | ORAL_CAPSULE | Freq: Two times a day (BID) | ORAL | 0 refills | Status: DC | PRN

## 2018-07-17 MED ORDER — VITAMIN D (ERGOCALCIFEROL) 1.25 MG (50000 UNIT) PO CAPS: 50000.0000 [IU] | ORAL_CAPSULE | ORAL | 0 refills | Status: DC

## 2018-07-17 NOTE — ED Provider Notes (Signed)
[facility]    CSN: [id number] Arrival date & time: 07/17/18  1000     History   Chief Complaint Chief Complaint  Patient presents with  . URI    HPI Rebecca Oconnor is a 53 y.o. female.   HPI  The patient is here with a couple of complaints.  Triage is reviewed. States she is had a cough for 2 weeks.  She states that she is coughing up scant sputum.  Her chest feels "tired" and "heavy".  The cough keeps her awake at night.  She states that she does feel short of breath and that has congestion in her chest.  No chest pain.  No wheezing.  No fever or chills.  She has been taking some over-the-counter medications. [facility] and being diagnosed with female infections.  She has not taken all of these medications.  Specifically she was diagnosed with an HSV infection, did not get the Valtrex.  She asked me if she should get it now.  She no longer has the rash.  I told her that if she gets the Valtrex, she should keep it in her medicine cabinet in case the rash would have her come back again. Her third problem is vague.  She states she has numbness in her abdomen in both of her legs that comes and goes.  She states when she wakes up in the morning both of her legs are numb.  As she walks she will feel this numbness going away.  At times she feels muscular weakness.  It is not present right now.  It is not continuous.  This is been going on and off for over a month.  She does not have a primary care doctor.  She has not had lab testing for some time.  She has no history of any neurologic problems, or back pain/spinal stenosis.  She was found to be significantly vitamin D deficient and given vitamin D supplements.  She took these until they ran out and then stopped.  Her vitamin D level was 5.2  Past Medical History:  Diagnosis Date  . 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 Hypertension     There are no  active problems to display for this patient.   Past Surgical History:  Procedure Laterality Date  . FINGER SURGERY     partial amputation    OB History    Gravida  0   Para  0   Term  0   Preterm  0   AB  0   Living  0     SAB  0   TAB  0   Ectopic  0   Multiple  0   Live Births  0            Home Medications    Prior to Admission medications   Medication Sig Start Date End Date Taking? Authorizing Provider  amLODipine (NORVASC) 5 MG tablet TK 1 T PO QD 05/24/18  Yes [provider]  aspirin EC 81 MG tablet Take 81 mg by mouth daily.   Yes [provider]  Cyanocobalamin (B-12 PO) Take by mouth.   Yes [provider]  Multiple Vitamins-Minerals (MULTIVITAMIN PO) Take by mouth.   Yes [provider]  omeprazole (PRILOSEC) 20 MG capsule Take 20 mg by mouth daily.   Yes [provider]  valsartan-hydrochlorothiazide (DIOVAN-HCT) 320-25  MG tablet Take 1 tablet by mouth daily.   Yes [provider]  benzonatate (TESSALON) 200 MG capsule Take 1 capsule (200 mg total) by mouth 2 (two) times daily as needed for cough. 07/17/18   Eustace MooreNelson, Armonii Sieh Sue, MD  valACYclovir (VALTREX) 500 MG tablet Take one tablet 2 x daily for 7 days 07/12/18   Verner CholLeonard, Deborah S, CNM  Vitamin D, Ergocalciferol, (DRISDOL) 1.25 MG (50000 UT) CAPS capsule Take 1 capsule (50,000 Units total) by mouth every 7 (seven) days. 07/17/18   Eustace MooreNelson, Erandy Mceachern Sue, MD    Family History Family History  Problem Relation Age of Onset  . Hypertension Mother   . Heart disease Mother   . Hypertension Father   . Hypertension Sister   . Hypertension Sister     Social History Social History   Tobacco Use  . Smoking status: Current Every Day Smoker  . Smokeless tobacco: Never Used  . Tobacco comment: 3 a day  Substance Use Topics  . Alcohol use: Yes    Alcohol/week: 2.0 standard drinks    Types: 2 Glasses of wine per week  . Drug use: Not Currently      Allergies   Patient has no known allergies.   Review of Systems Review of Systems  Constitutional: Negative for chills and fever.  HENT: Negative for ear pain and sore throat.   Eyes: Negative for pain and visual disturbance.  Respiratory: Positive for cough and chest tightness. Negative for shortness of breath.   Cardiovascular: Negative for chest pain and palpitations.  Gastrointestinal: Negative for abdominal pain and vomiting.  Genitourinary: Negative for dysuria and hematuria.  Musculoskeletal: Negative for arthralgias, back pain, neck pain and neck stiffness.  Skin: Negative for color change and rash.  Neurological: Positive for numbness. Negative for seizures, syncope and headaches.  All other systems reviewed and are negative.    Physical Exam Triage Vital Signs ED Triage Vitals  Enc Vitals Group     BP 07/17/18 1044 129/90     Pulse Rate 07/17/18 1044 (!) 132     Resp -- 18     Temp 07/17/18 1044 98.4 F (36.9 C)     Temp Source 07/17/18 1044 Oral     SpO2 07/17/18 1044 99 %     Weight --      Height --      Head Circumference --      Peak Flow --      Pain Score 07/17/18 1054 0     Pain Loc --      Pain Edu? --      Excl. in GC? --    No data found.  Updated Vital Signs BP 129/90 (BP Location: Right Arm)   Pulse (!) 132   Temp 98.4 F (36.9 C) (Oral)   SpO2 99%  Repeat pulse 110    Physical Exam Constitutional:      General: She is not in acute distress.    Appearance: She is well-developed.  HENT:     Head: Normocephalic and atraumatic.     Right Ear: Tympanic membrane and ear canal normal.     Left Ear: Tympanic membrane and ear canal normal.     Mouth/Throat:     Mouth: Mucous membranes are moist.  Eyes:     Conjunctiva/sclera: Conjunctivae normal.     Pupils: Pupils are equal, round, and reactive to light.  Neck:     Musculoskeletal: Normal range of motion.  Cardiovascular:  Rate and Rhythm: Normal rate and regular rhythm.      Heart sounds: Normal heart sounds.  Pulmonary:     Effort: Pulmonary effort is normal. No respiratory distress.     Breath sounds: Normal breath sounds. No stridor. No rhonchi.  Abdominal:     General: There is no distension.     Palpations: Abdomen is soft.  Musculoskeletal: Normal range of motion.  Skin:    General: Skin is warm and dry.  Neurological:     Mental Status: She is alert.     Sensory: No sensory deficit.     Coordination: Coordination normal.     Deep Tendon Reflexes: Reflexes normal.  Psychiatric:        Mood and Affect: Mood normal.      UC Treatments / Results  Labs (all labs ordered are listed, but only abnormal results are displayed) Labs Reviewed  POCT I-STAT, CHEM 8 - Abnormal; Notable for the following components:      Result Value   Sodium 134 (*)    BUN 5 (*)    All other components within normal limits  I-STAT CHEM 8, ED    EKG None  Radiology No results found.  Procedures Procedures (including critical care time)  Medications Ordered in UC Medications - No data to display  Initial Impression / Assessment and Plan / UC Course  I have reviewed the triage vital signs and the nursing notes.  Pertinent labs & imaging results that were available during my care of the patient were reviewed by me and considered in my medical decision making (see chart for details).      I explained to the patient that she has a persistent cough with no lung sounds, and no sputum production, no fever.  I feel like this is a virus.  We did treat her with Tessalon. Since she has a known vitamin D deficiency and is out of this medication I am going to refill her vitamin D. She needs a primary care doctor to work-up the numbness in her legs.  She has no physical exam findings to support her neuropathy with normal sensation, muscular tone, and reflexes at this visit. Final Clinical Impressions(s) / UC Diagnoses   Final diagnoses:  Viral URI with cough  Numbness  in both legs     Discharge Instructions     You need to go to a primary care doctor to sort out the numbness complaint  I will refill the vitamin D 37169 U once a week for 12 weeks After this take 2000 U a day (OTC)  I am prescribing tessalon for the cough Drink more fluids  Blood work today was within normal limits, but is not comprehensive.   ED Prescriptions    Medication Sig Dispense Auth. Provider   benzonatate (TESSALON) 200 MG capsule Take 1 capsule (200 mg total) by mouth 2 (two) times daily as needed for cough. 20 capsule Eustace Moore, MD   Vitamin D, Ergocalciferol, (DRISDOL) 1.25 MG (50000 UT) CAPS capsule Take 1 capsule (50,000 Units total) by mouth every 7 (seven) days. 12 capsule Eustace Moore, MD     Controlled Substance Prescriptions [place] Controlled Substance Registry consulted? Not Applicable   Eustace Moore, MD 07/17/18 2015

## 2018-07-17 NOTE — Discharge Instructions (Signed)
You need to go to a primary care doctor to sort out the numbness complaint  I will refill the vitamin D 16244 U once a week for 12 weeks After this take 2000 U a day (OTC)  I am prescribing tessalon for the cough Drink more fluids  Blood work today was within normal limits, but is not comprehensive.

## 2018-07-17 NOTE — ED Triage Notes (Addendum)
Pt states she has had a cough x2 weeks.  She states that she has been trying to treat it her self but she is now having numbness in her legs and abdomen.  Pt [facility] before [facility] and she was told she was extremely dehydrated and she was given a Rx for Vitamin D.  Pt is SOB and is having some trouble speaking due to the coughing.  Pt did not pick up her Rx for Valtrex on January 2.  She was positive for several infections over the last few weeks.

## 2018-07-19 ENCOUNTER — Ambulatory Visit (INDEPENDENT_AMBULATORY_CARE_PROVIDER_SITE_OTHER): Payer: Managed Care, Other (non HMO)

## 2018-07-19 DIAGNOSIS — R350 Frequency of micturition: Secondary | ICD-10-CM | POA: Diagnosis not present

## 2018-07-19 NOTE — Progress Notes (Signed)
Patient in office today for a urine culture. Clean-catch urine obtained and drawn up for a urine culture. Culture has been taken to the lab to be resulted.  Routing to provider and will close encounter.

## 2018-07-21 LAB — URINE CULTURE

## 2018-07-22 ENCOUNTER — Telehealth (HOSPITAL_COMMUNITY): Payer: Self-pay | Admitting: Emergency Medicine

## 2018-07-22 NOTE — Telephone Encounter (Signed)
Attempted to return patients voicemails. NO answer.

## 2018-08-01 ENCOUNTER — Telehealth: Payer: Self-pay | Admitting: Certified Nurse Midwife

## 2018-08-01 NOTE — Telephone Encounter (Signed)
Spoke with patient. Patient reports pain in BLE that is keeping her awake at night. Mild swelling. Bottom of feet feel "sore". Can't sleep, sleeps with legs elevated for comfort. Reports intermittent numbness in fingers when sleeping. Taking motrin q4 with no relief. Denies SOB, weakness, N/V, fever/chills, or GYN concerns. Patient has HX of HTN. Not on any contraceptives.   Patient was seen at [facility] on 07/17/18 for evaluation, was advised to f/u with PCP. Patient is scheduled to see new PCP on 1/29, is on cancellation list.   Patient asking if glucose checked at last OV and if any additional recommendations?   Advised glucose not checked. Recommended patient f/u with PCP for further evaluation of symptoms. Seek immediate evaluation at [facility] or ER if symptoms worsen or new symptoms develop. Advised Leota Sauers, CNM will review, I will return call if any additional recommendations. Patient verbalizes understanding.   Routing to provider for final review. Patient is agreeable to disposition. Will close encounter.

## 2018-08-01 NOTE — Telephone Encounter (Signed)
Agree with plan 

## 2018-08-01 NOTE — Telephone Encounter (Signed)
Patient returned call

## 2018-08-01 NOTE — Telephone Encounter (Signed)
Left message to call Adrielle Polakowski, RN at [facility] [phone].   

## 2018-08-01 NOTE — Telephone Encounter (Signed)
Patient wanting to know if she was tested for diabetes. She is still having leg pain.

## 2018-08-08 ENCOUNTER — Ambulatory Visit (INDEPENDENT_AMBULATORY_CARE_PROVIDER_SITE_OTHER): Payer: Managed Care, Other (non HMO) | Admitting: Family Medicine

## 2018-08-08 ENCOUNTER — Encounter: Payer: Self-pay | Admitting: Family Medicine

## 2018-08-08 VITALS — BP 145/114 | HR 114 | Resp 17 | Ht 66.0 in | Wt 161.2 lb

## 2018-08-08 DIAGNOSIS — I10 Essential (primary) hypertension: Secondary | ICD-10-CM | POA: Diagnosis not present

## 2018-08-08 DIAGNOSIS — M792 Neuralgia and neuritis, unspecified: Secondary | ICD-10-CM | POA: Diagnosis not present

## 2018-08-08 DIAGNOSIS — Z7689 Persons encountering health services in other specified circumstances: Secondary | ICD-10-CM

## 2018-08-08 DIAGNOSIS — R202 Paresthesia of skin: Secondary | ICD-10-CM

## 2018-08-08 DIAGNOSIS — B009 Herpesviral infection, unspecified: Secondary | ICD-10-CM

## 2018-08-08 DIAGNOSIS — F17210 Nicotine dependence, cigarettes, uncomplicated: Secondary | ICD-10-CM

## 2018-08-08 DIAGNOSIS — R200 Anesthesia of skin: Secondary | ICD-10-CM

## 2018-08-08 MED ORDER — VALACYCLOVIR HCL 500 MG PO TABS: ORAL_TABLET | ORAL | 0 refills | Status: DC

## 2018-08-08 MED ORDER — VALSARTAN-HYDROCHLOROTHIAZIDE 320-25 MG PO TABS: 1.0000 | ORAL_TABLET | Freq: Every day | ORAL | 1 refills | Status: DC

## 2018-08-08 MED ORDER — GABAPENTIN 100 MG PO CAPS: ORAL_CAPSULE | ORAL | 1 refills | Status: DC

## 2018-08-08 MED ORDER — AMLODIPINE BESYLATE 5 MG PO TABS: ORAL_TABLET | ORAL | 1 refills | Status: DC

## 2018-08-08 MED ORDER — OMEPRAZOLE 20 MG PO CPDR: 20.0000 mg | DELAYED_RELEASE_CAPSULE | Freq: Every day | ORAL | 1 refills | Status: DC

## 2018-08-08 NOTE — Progress Notes (Signed)
Rebecca Oconnor, is a 54 y.o. female  BDZ:[id number]  TMH:[id number]  DOB - [birth year]  CC:  Chief Complaint  Patient presents with  . Establish Care  . Hypertension       HPI: Rebecca Oconnor is a 53 y.o. female is here today to establish care.   Rebecca Oconnor does not have a problem list on file.   Bilateral lower extremity numbness Reports a history of vitamin B12 deficiency and vitamin D deficiency. Complains of  numbness and tingling began late November in her bilateral legs. She was seen by her OBGYN for a wellness visit and mention this problem during that time and was started on vitamin D. Pain has gradually worsened. Complains of gait instability and reports recent falls due to severity of pain. Pain is currently affecting ability to obtain sleep. She is currently taking ibuprofen without relief. Denies dizziness or visual disturbances.  Hypertension Rebecca Oconnor reports no home monitoring of blood pressure. Reports adherence to blood pressure medications. Reports efforts to adhere to low sodium diet. She  is a current daily smoker. Endorses a few days per week alcohol use.  Denies any episodes of dizziness, headaches, shortness of breath, or chest pain. Medications have been refilled by OB/GYN in the past.   Patient denies new headaches, chest pain, abdominal pain, nausea, new weakness ,  SOB, or edema.    Current medications: Current Outpatient Medications:  .  aspirin EC 81 MG tablet, Take 81 mg by mouth daily., Disp: , Rfl:  .  Cyanocobalamin (B-12 PO), Take by mouth., Disp: , Rfl:  .  Multiple Vitamins-Minerals (MULTIVITAMIN PO), Take by mouth., Disp: , Rfl:  .  omeprazole (PRILOSEC) 20 MG capsule, Take 20 mg by mouth daily., Disp: , Rfl:  .  valACYclovir (VALTREX) 500 MG tablet, Take one tablet 2 x daily for 7 days, Disp: 30 tablet, Rfl: 0 .  Vitamin D, Ergocalciferol, (DRISDOL) 1.25 MG (50000 UT) CAPS capsule, Take 1 capsule (50,000 Units total) by mouth every 7 (seven) days.,  Disp: 12 capsule, Rfl: 0 .  amLODipine (NORVASC) 5 MG tablet, TK 1 T PO QD, Disp: , Rfl: 0 .  valsartan-hydrochlorothiazide (DIOVAN-HCT) 320-25 MG tablet, Take 1 tablet by mouth daily., Disp: , Rfl:    Pertinent family medical history: family history includes Heart disease in her mother; Hypertension in her father, mother, sister, and sister.   No Known Allergies  Social History   Socioeconomic History  . Marital status: Single    Spouse name: Not on file  . Number of children: Not on file  . Years of education: Not on file  . Highest education level: Not on file  Occupational History  . Not on file  Social Needs  . Financial resource strain: Not on file  . Food insecurity:    Worry: Not on file    Inability: Not on file  . Transportation needs:    Medical: Not on file    Non-medical: Not on file  Tobacco Use  . Smoking status: Current Every Day Smoker  . Smokeless tobacco: Never Used  . Tobacco comment: 3 a day  Substance and Sexual Activity  . Alcohol use: Yes    Alcohol/week: 2.0 standard drinks    Types: 2 Glasses of wine per week  . Drug use: Not Currently  . Sexual activity: Not Currently    Partners: Male    Birth control/protection: Abstinence  Lifestyle  . Physical activity:    Days per week: Not on  file    Minutes per session: Not on file  . Stress: Not on file  Relationships  . Social connections:    Talks on phone: Not on file    Gets together: Not on file    Attends religious service: Not on file    Active member of club or organization: Not on file    Attends meetings of clubs or organizations: Not on file    Relationship status: Not on file  . Intimate partner violence:    Fear of current or ex partner: Not on file    Emotionally abused: Not on file    Physically abused: Not on file    Forced sexual activity: Not on file  Other Topics Concern  . Not on file  Social History Narrative  . Not on file    Review of Systems: Pertinent negatives  listed in HPI   Objective:   Vitals:   08/08/18 1506  BP: (!) 145/114  Pulse: (!) 114  Resp: 17  SpO2: 96%    BP Readings from Last 3 Encounters:  08/08/18 (!) 145/114  07/19/18 110/60  07/17/18 129/90    Filed Weights   08/08/18 1506  Weight: 161 lb 3.2 oz (73.1 kg)      Physical Exam: Constitutional: Patient appears well-developed and well-nourished. No distress. HENT: Normocephalic, atraumatic, External right and left ear normal. Oropharynx is clear and moist.  Eyes: Conjunctivae and EOM are normal. PERRLA, no scleral icterus. Neck: Normal ROM. Neck supple. No JVD. No tracheal deviation. No thyromegaly. CVS: RRR, S1/S2 +, no murmurs, no gallops, no carotid bruit.  Pulmonary: Effort and breath sounds normal, no stridor, rhonchi, wheezes, rales.  Abdominal: Soft. BS +, no distension, tenderness, rebound or guarding.  Musculoskeletal: Normal range of motion. No edema and no tenderness.  Neuro: Alert. Normal muscle tone coordination. Normal gait. BUE 5/5.  BLE strength asymmetrical and diminished. Bilateral hand grips symmetrical. Skin: Skin is warm and dry. No rash noted. Not diaphoretic. No erythema. No pallor. Psychiatric: Normal mood and affect. Behavior, judgment, thought content normal.  Lab Results (prior encounters)  Lab Results  Component Value Date   WBC 6.4 06/20/2018   HGB 12.6 07/17/2018   HCT 37.0 07/17/2018   MCV 86 06/20/2018   PLT 320 06/20/2018   Lab Results  Component Value Date   CREATININE 0.70 07/17/2018   BUN 5 (L) 07/17/2018   NA 134 (L) 07/17/2018   K 4.1 07/17/2018   CL 99 07/17/2018    No results found for: HGBA1C  No results found for: CHOL, TRIG, HDL, CHOLHDL, VLDL, LDLCALC      Assessment and plan:  1. Encounter to establish care  2. Numbness and tingling of lower extremity Idiopathic 2 months history of BLE numbness and tingling. No DM history.  Pain is gradually worsening. Will trial Gabapentin 300 mg once daily at  bedtime. Will refer to neurology. Checking:  - Vitamin D, 25-hydroxy - Hemoglobin A1c - Comprehensive metabolic panel - Vitamin B12 - Ambulatory referral to Neurology  3. Essential hypertension, not at goal today -Refilled current medication  -Will have patient return for BP check to ensure medication is controlling blood pressure.  4. Neuropathic pain See # 2 - Ambulatory referral to Neurology  5. HSV-2 infection - valACYclovir (VALTREX) 500 MG tablet; Take one tablet 2 x daily for 7 days  Dispense: 30 tablet; Refill: 0  Meds ordered this encounter  Medications  . DISCONTD: gabapentin (NEURONTIN) 100 MG capsule  Sig: Take 100 mg PO once daily in the morning. Take 300 mg PO once daily at bedtime    Dispense:  120 capsule    Refill:  1  . valACYclovir (VALTREX) 500 MG tablet    Sig: Take one tablet 2 x daily for 7 days    Dispense:  30 tablet    Refill:  0  . valsartan-hydrochlorothiazide (DIOVAN-HCT) 320-25 MG tablet    Sig: Take 1 tablet by mouth daily.    Dispense:  90 tablet    Refill:  1    Pt will pick-up as needed-new PCP  . omeprazole (PRILOSEC) 20 MG capsule    Sig: Take 1 capsule (20 mg total) by mouth daily.    Dispense:  90 capsule    Refill:  1    Pt will pick-up as needed-new PCP  . DISCONTD: gabapentin (NEURONTIN) 100 MG capsule    Sig: Take 100 mg PO once daily in the morning. Take 300 mg PO once daily at bedtime    Dispense:  120 capsule    Refill:  1  . amLODipine (NORVASC) 5 MG tablet    Sig: TK 1 T PO QD    Dispense:  90 tablet    Refill:  1    Pt will pick-up as needed-new PCP     Return in about 3 months (around 11/07/2018) for hypertension .   The patient was given clear instructions to go to ER or return to medical center if symptoms don't improve, worsen or new problems develop. The patient verbalized understanding. The patient was advised  to call and obtain lab results if they haven't heard anything from out office within 7-10 business  days.  Joaquin Courts, FNP Primary Care at [facility] [address] [place] [address] [phone]: [phone]    This note has been created with Dragon speech recognition software and Paediatric nurse. Any transcriptional errors are unintentional.

## 2018-08-08 NOTE — Patient Instructions (Addendum)
[facility]    Rebecca Oconnor was seen by Joaquin CourtsKimberly Dameir Gentzler, FNP today.   Rebecca Oconnor's primary care doctor is Bing NeighborsHarris, Amberlyn Martinezgarcia S, FNP.   For the best care possible,  you should try to see Joaquin CourtsKimberly Dontay Harm, FNP-C  whenever you come to clinic.   We look forward to seeing you again soon!  If you have any questions about your visit today,  please call us at [phone]  Or feel free to reach your provider via MyChart.    Contact your pharmacy for refills. I have update pharmacy that I am now your PCP.     Neuropathic Pain Neuropathic pain is pain caused by damage to the nerves that are responsible for certain sensations in your body (sensory nerves). The pain can be caused by:  Damage to the sensory nerves that send signals to your spinal cord and brain (peripheral nervous system).  Damage to the sensory nerves in your brain or spinal cord (central nervous system). Neuropathic pain can make you more sensitive to pain. Even a minor sensation can feel very painful. This is usually a long-term condition that can be difficult to treat. The type of pain differs from person to person. It may:  Start suddenly (acute), or it may develop slowly and last for a long time (chronic).  Come and go as damaged nerves heal, or it may stay at the same level for years.  Cause emotional distress, loss of sleep, and a lower quality of life. What are the causes? The most common cause of this condition is diabetes. Many other diseases and conditions can also cause neuropathic pain. Causes of neuropathic pain can be classified as:  Toxic. This is caused by medicines and chemicals. The most common cause of toxic neuropathic pain is damage from cancer treatments (chemotherapy).  Metabolic. This can be caused by: ? Diabetes. This is the most common disease that damages the nerves. ? Lack of vitamin B from long-term alcohol  abuse.  Traumatic. Any injury that cuts, crushes, or stretches a nerve can cause damage and pain. A common example is feeling pain after losing an arm or leg (phantom limb pain).  Compression-related. If a sensory nerve gets trapped or compressed for a long period of time, the blood supply to the nerve can be cut off.  Vascular. Many blood vessel diseases can cause neuropathic pain by decreasing blood supply and oxygen to nerves.  Autoimmune. This type of pain results from diseases in which the body's defense system (immune system) mistakenly attacks sensory nerves. Examples of autoimmune diseases that can cause neuropathic pain include lupus and multiple sclerosis.  Infectious. Many types of viral infections can damage sensory nerves and cause pain. Shingles infection is a common cause of this type of pain.  Inherited. Neuropathic pain can be a symptom of many diseases that are passed down through families (genetic). What increases the risk? You are more likely to develop this condition if:  You have diabetes.  You smoke.  You drink too much alcohol.  You are taking certain medicines, including medicines that kill cancer cells (chemotherapy) or that treat immune system disorders. What are the signs or symptoms? The main symptom is pain. Neuropathic pain is often described as:  Burning.  Shock-like.  Stinging.  Hot or cold.  Itching. How is this diagnosed? No single test can diagnose neuropathic pain. It is diagnosed based on:  Physical exam and your symptoms.  Your health care provider will ask you about your pain. You may be asked to use a pain scale to describe how bad your pain is.  Tests. These may be done to see if you have a high sensitivity to pain and to help find the cause and location of any sensory nerve damage. They include: ? Nerve conduction studies to test how well nerve signals travel through your sensory nerves (electrodiagnostic testing). ? Stimulating your  sensory nerves through electrodes on your skin and measuring the response in your spinal cord and brain (somatosensory evoked potential).  Imaging studies, such as: ? X-rays. ? CT scan. ? MRI. How is this treated? Treatment for neuropathic pain may change over time. You may need to try different treatment options or a combination of treatments. Some options include:  Treating the underlying cause of the neuropathy, such as diabetes, kidney disease, or vitamin deficiencies.  Stopping medicines that can cause neuropathy, such as chemotherapy.  Medicine to relieve pain. Medicines may include: ? Prescription or over-the-counter pain medicine. ? Anti-seizure medicine. ? Antidepressant medicines. ? Pain-relieving patches that are applied to painful areas of skin. ? A medicine to numb the area (local anesthetic), which can be injected as a nerve block.  Transcutaneous nerve stimulation. This uses electrical currents to block painful nerve signals. The treatment is painless.  Alternative treatments, such as: ? Acupuncture. ? Meditation. ? Massage. ? Physical therapy. ? Pain management programs. ? Counseling. Follow these instructions at home: Medicines   Take over-the-counter and prescription medicines only as told by your health care provider.  Do not drive or use heavy machinery while taking prescription pain medicine.  If you are taking prescription pain medicine, take actions to prevent or treat constipation. Your health care provider may recommend that you: ? Drink enough fluid to keep your urine pale yellow. ? Eat foods that are high in fiber, such as fresh fruits and vegetables, whole grains, and beans. ? Limit foods that are high in fat and processed sugars, such as fried or sweet foods. ? Take an over-the-counter or prescription medicine for constipation. Lifestyle   Have a good support system at home.  Consider joining a chronic pain support group.  Do not use any  products that contain nicotine or tobacco, such as cigarettes and e-cigarettes. If you need help quitting, ask your health care provider.  Do not drink alcohol. General instructions  Learn as much as you can about your condition.  Work closely with all your health care providers to find the treatment plan that works best for you.  Ask your health care provider what activities are safe for you.  Keep all follow-up visits as told by your health care provider. This is important. Contact a health care provider if:  Your pain treatments are not working.  You are having side effects from your medicines.  You are struggling with tiredness (fatigue), mood changes, depression, or anxiety. Summary  Neuropathic pain is pain caused by damage to the nerves that are responsible for certain sensations in your body (sensory nerves).  Neuropathic pain may come and go as damaged nerves heal, or it may stay at the same level for years.  Neuropathic pain is usually a long-term condition that can be difficult to treat. Consider joining a chronic pain support group. This information is not intended to replace advice given to you by your health care provider. Make sure you discuss any questions you have with your health care provider. Document Released: 03/24/2004 Document  Revised: 07/14/2017 Document Reviewed: 07/14/2017 Elsevier Interactive Patient Education  [employer]2019 Else[employer] Inc[employer]

## 2018-08-09 LAB — COMPREHENSIVE METABOLIC PANEL
A/G RATIO: 1.6 (ref 1.2–2.2)
ALBUMIN: 4.3 g/dL (ref 3.8–4.8)
ALK PHOS: 85 IU/L (ref 39–117)
ALT: 61 IU/L — ABNORMAL HIGH (ref 0–32)
AST: 157 IU/L — AB (ref 0–40)
BUN / CREAT RATIO: 10 (ref 9–23)
BUN: 10 mg/dL (ref 6–24)
Bilirubin Total: 0.4 mg/dL (ref 0.0–1.2)
CO2: 22 mmol/L (ref 20–29)
CREATININE: 0.99 mg/dL (ref 0.57–1.00)
Calcium: 10.1 mg/dL (ref 8.7–10.2)
Chloride: 98 mmol/L (ref 96–106)
GFR calc Af Amer: 77 mL/min/{1.73_m2} (ref >59)
GFR, EST NON AFRICAN AMERICAN: 67 mL/min/{1.73_m2} (ref >59)
GLOBULIN, TOTAL: 2.7 g/dL (ref 1.5–4.5)
Glucose: 88 mg/dL (ref 65–99)
POTASSIUM: 4.2 mmol/L (ref 3.5–5.2)
Sodium: 139 mmol/L (ref 134–144)
Total Protein: 7 g/dL (ref 6.0–8.5)

## 2018-08-09 LAB — VITAMIN B12: Vitamin B-12: 945 pg/mL (ref 232–1245)

## 2018-08-09 LAB — VITAMIN D 25 HYDROXY (VIT D DEFICIENCY, FRACTURES): VIT D 25 HYDROXY: 33.8 ng/mL (ref 30.0–100.0)

## 2018-08-09 LAB — HEMOGLOBIN A1C
Est. average glucose Bld gHb Est-mCnc: 100 mg/dL
HEMOGLOBIN A1C: 5.1 % (ref 4.8–5.6)

## 2018-08-13 ENCOUNTER — Encounter: Payer: Self-pay | Admitting: Neurology

## 2018-08-13 ENCOUNTER — Ambulatory Visit: Payer: Managed Care, Other (non HMO) | Admitting: Neurology

## 2018-08-13 ENCOUNTER — Telehealth: Payer: Self-pay | Admitting: Family Medicine

## 2018-08-13 VITALS — BP 129/86 | HR 115 | Ht 66.0 in | Wt 162.0 lb

## 2018-08-13 DIAGNOSIS — R202 Paresthesia of skin: Secondary | ICD-10-CM

## 2018-08-13 DIAGNOSIS — R748 Abnormal levels of other serum enzymes: Secondary | ICD-10-CM

## 2018-08-13 DIAGNOSIS — G3281 Cerebellar ataxia in diseases classified elsewhere: Secondary | ICD-10-CM

## 2018-08-13 DIAGNOSIS — R269 Unspecified abnormalities of gait and mobility: Secondary | ICD-10-CM | POA: Diagnosis not present

## 2018-08-13 MED ORDER — GABAPENTIN 300 MG PO CAPS: ORAL_CAPSULE | ORAL | 3 refills | Status: DC

## 2018-08-13 NOTE — Telephone Encounter (Signed)
Patient notified of lab results & recommendations. Let patient know that she would need to return for a lab visit the week of February 17th. When I tried to go through days/times to schedule an appointment patient hung up the phone.

## 2018-08-13 NOTE — Telephone Encounter (Signed)
Notify patient that all labs were within expected range with the exception of her liver enzymes are elevated. I have no liver studies to compare to, however I recommend that she reduce consumption of alcohol.  Return in 2 weeks for repeat liver studies. If liver function remains abnormal will obtain a liver ultrasound.

## 2018-08-13 NOTE — Progress Notes (Signed)
Reason for visit: Numbness, gait disturbance  Referring physician: Dr. Fransisco Beau Rebecca Oconnor is a 53 y.o. female  History of present illness:  Rebecca Oconnor is a 53 year old right-handed black female with a history of onset of toe cramping and cramps in the calf muscles that began in the latter part of November 2019.  By early December 2019, she began having some numbness around the knees that spread down to the top of the foot, the patient now has discomfort in the feet bilaterally and some slight swelling.  The numbness has ascended up the legs bilaterally and has gone up the anterior portion of the body and up to the mid chest level.  She now has some numbness and tingling sensations into the hands bilaterally.  She denies any neck or mid back discomfort.  She has no numbness sensations on her back, she denies issues controlling the bowels or the bladder.  She does have some gait instability, she has fallen on occasion.  She reports no vision changes or dizziness, she has no speech changes or difficulty with swallowing.  She has been placed on gabapentin in low-dose for her foot discomfort but this has not been very helpful.  She is having problems sleeping night due to pain.  She is sent to this office for an evaluation.  Past Medical History:  Diagnosis Date  . 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 Hypertension     Past Surgical History:  Procedure Laterality Date  . FINGER SURGERY     partial amputation    Family History  Problem Relation Age of Onset  . Hypertension Mother   . Heart disease Mother   . Hypertension Father   . Hypertension Sister   . Hypertension Sister     Social history:  reports that she has been smoking. She has never used smokeless tobacco. She reports current alcohol use of about 2.0 standard drinks of alcohol per week. She reports previous drug use.  Medications:  Prior to Admission medications     Medication Sig Start Date End Date Taking? Authorizing Provider  amLODipine (NORVASC) 5 MG tablet TK 1 T PO QD 08/08/18  Yes Bing Neighbors, FNP  aspirin EC 81 MG tablet Take 81 mg by mouth daily.   Yes [provider]  Cyanocobalamin (B-12 PO) Take by mouth.   Yes [provider]  Multiple Vitamins-Minerals (MULTIVITAMIN PO) Take by mouth.   Yes [provider]  omeprazole (PRILOSEC) 20 MG capsule Take 1 capsule (20 mg total) by mouth daily. 08/08/18  Yes Bing Neighbors, FNP  valACYclovir (VALTREX) 500 MG tablet Take one tablet 2 x daily for 7 days 08/08/18  Yes Bing Neighbors, FNP  valsartan-hydrochlorothiazide (DIOVAN-HCT) 320-25 MG tablet Take 1 tablet by mouth daily. 08/08/18  Yes Bing Neighbors, FNP  Vitamin D, Ergocalciferol, (DRISDOL) 1.25 MG (50000 UT) CAPS capsule Take 1 capsule (50,000 Units total) by mouth every 7 (seven) days. 07/17/18  Yes Eustace Moore, MD  gabapentin (NEURONTIN) 300 MG capsule One capsule in the morning and 2 in the evening 08/13/18   York Spaniel, MD     No Known Allergies  ROS:  Out of a complete 14 system review of symptoms, the patient complains only of the following symptoms, and all other reviewed systems are negative.  Weight loss Swelling in the legs Ringing in the ears Moles Snoring Feeling hot, cold  Muscle cramps, aching muscles Numbness, weakness, dizziness, passing out Restless legs  Blood pressure 129/86, pulse (!) 115, height 5\' 6"  (1.676 m), weight 162 lb (73.5 kg).  Physical Exam  General: The patient is alert and cooperative at the time of the examination.  Eyes: Pupils are equal, round, and reactive to light. Discs are flat bilaterally.  Neck: The neck is supple, no carotid bruits are noted.  Respiratory: The respiratory examination is clear.  Cardiovascular: The cardiovascular examination reveals a regular rate and rhythm, no obvious murmurs or rubs are noted.  Skin:  Extremities are without significant edema.  Neurologic Exam  Mental status: The patient is alert and oriented x 3 at the time of the examination. The patient has apparent normal recent and remote memory, with an apparently normal attention span and concentration ability.  Cranial nerves: Facial symmetry is present. There is good sensation of the face to pinprick and soft touch bilaterally. The strength of the facial muscles and the muscles to head turning and shoulder shrug are normal bilaterally. Speech is well enunciated, no aphasia or dysarthria is noted. Extraocular movements are full. Visual fields are full. The tongue is midline, and the patient has symmetric elevation of the soft palate. No obvious hearing deficits are noted.  Motor: The motor testing reveals 5 over 5 strength of all 4 extremities, with exception of slight weakness with hip flexion bilaterally. Good symmetric motor tone is noted throughout.  Sensory: Sensory testing is intact to pinprick, soft touch, vibration sensation, and position sense on the upper extremities.  With the lower extremities, the patient has significant decrease in pinprick sensation below the knees bilaterally, she has retained vibration and position sense bilaterally in the feet.  No evidence of extinction is noted.  Coordination: Cerebellar testing reveals good finger-nose-finger and heel-to-shin bilaterally.  Gait and station: Gait is normal. Tandem gait is slightly unsteady. Romberg is negative. No drift is seen.  Reflexes: Deep tendon reflexes are symmetric and normal in the arms, the biceps reflexes are well-maintained, slightly depressed triceps reflexes are seen bilaterally.  With the lower extremities, the reflexes are absent throughout.  Toes are neutral bilaterally.   Assessment/Plan:  1.  Numbness, gait disorder  2.  Elevated liver function tests  The patient has developed in a sending numbness pattern up to the mid chest level, reflexes  in the lower extremities are absent but are still present in the arms which would go against the diagnosis of CIDP or Guillian-Barr syndrome.  The patient has some alteration in strength and balance.  Etiology of the changes is not yet clear.  Given the pattern of numbness, MRI of the cervical spine and thoracic spine will be done with and without gadolinium enhancement to rule out a transverse myelitis or demyelinating disease.  Blood work will be done today.  If the above studies are unremarkable, we will perform EMG and nerve conduction study evaluation.  The patient otherwise will follow-up in 3 months.  Marlan Palau MD 08/13/2018 4:46 PM  [facility] [address] [place], [place] [address]  Phone [phone] Fax [phone]

## 2018-08-14 ENCOUNTER — Telehealth: Payer: Self-pay | Admitting: Neurology

## 2018-08-14 LAB — RHEUMATOID FACTOR: Rheumatoid fact SerPl-aCnc: 11.7 IU/mL (ref 0.0–13.9)

## 2018-08-14 LAB — B. BURGDORFI ANTIBODIES: Lyme IgG/IgM Ab: <0.91 {ISR} (ref 0.00–0.90)

## 2018-08-14 LAB — SEDIMENTATION RATE: Sed Rate: 35 mm/hr (ref 0–40)

## 2018-08-14 LAB — ANGIOTENSIN CONVERTING ENZYME: Angio Convert Enzyme: 102 U/L — ABNORMAL HIGH (ref 14–82)

## 2018-08-14 LAB — ANA W/REFLEX: ANA: NEGATIVE

## 2018-08-14 LAB — HIV ANTIBODY (ROUTINE TESTING W REFLEX): HIV Screen 4th Generation wRfx: NONREACTIVE

## 2018-08-14 NOTE — Telephone Encounter (Signed)
Spoke to the patient she is aware . I gave her GI phone number of [phone].

## 2018-08-14 NOTE — Telephone Encounter (Signed)
[employer] order sent to GI they will reach out to the pt to schedule.

## 2018-08-14 NOTE — Telephone Encounter (Signed)
I called the patient.  The blood work was unremarkable except that the angiotensin-converting enzyme level was elevated, the patient is a smoker but we still may want to check a chest x-ray at some point in the future.

## 2018-08-21 ENCOUNTER — Ambulatory Visit
Admission: RE | Admit: 2018-08-21 | Discharge: 2018-08-21 | Disposition: A | Discharge status: Home / Self Care | Payer: Managed Care, Other (non HMO) | Source: Ambulatory Visit | Attending: Neurology | Admitting: Neurology

## 2018-08-21 DIAGNOSIS — G3281 Cerebellar ataxia in diseases classified elsewhere: Secondary | ICD-10-CM

## 2018-08-21 MED ORDER — GADOBENATE DIMEGLUMINE 529 MG/ML IV SOLN
15.0000 mL | Freq: Once | INTRAVENOUS | Status: AC | PRN
  Administered 2018-08-21: 15 mL via INTRAVENOUS

## 2018-08-21 NOTE — Telephone Encounter (Signed)
Rutherford Nail: [id number] (exp. 08/20/18 to 11/18/18) patient is scheduled at GI for 08/21/18.

## 2018-08-23 ENCOUNTER — Telehealth: Payer: Self-pay | Admitting: Neurology

## 2018-08-23 DIAGNOSIS — R202 Paresthesia of skin: Secondary | ICD-10-CM

## 2018-08-23 NOTE — Telephone Encounter (Signed)
I contacted the pt and advise we are currently waiting on the results to be reviewed. Pt verbalized understanding.

## 2018-08-23 NOTE — Telephone Encounter (Signed)
Pt is requesting MRI results. She had it on 2/11 and is aware it can take 3-5 days for the results.

## 2018-08-23 NOTE — Telephone Encounter (Signed)
  I called the patient.  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  I am not clear that this is related to the symptoms described by the patient.  The patient will be set up for nerve conduction studies on both legs and one arm, EMG on one leg.  MRI cervical 08/23/18:  IMPRESSION:   MRI cervical spine (with and without) demonstrating: - Hazy, central STIR hyperintensity from C7 level down to T2 level (sagittal STIR views; series 1 image 7).  No abnormal enhancement. Could represent idiopathic transverse myelitis vs syringomyelia. - At C5-6: disc bulging with moderate biforaminal stenosis. - At C6-7: left uncovertebral joint hypertrophy and disc bulging with severe left foraminal stenosis.    MRI thoracic 08/23/18:  IMPRESSION:   MRI thoracic spine (with and without) demonstrating: - The spinal cord is notable for thin hazy STIR hyperintensity from C6 down to T12 levels.  No abnormal enhancement. Could represent idiopathic transverse myelitis versus syringomyelia. - No spinal stenosis or foraminal narrowing.

## 2018-08-24 ENCOUNTER — Telehealth: Payer: Self-pay | Admitting: Neurology

## 2018-08-24 MED ORDER — GABAPENTIN 600 MG PO TABS: ORAL_TABLET | ORAL | 3 refills | Status: DC

## 2018-08-24 NOTE — Telephone Encounter (Signed)
Called patient to schedule NCV / EMG test Dr. Anne Hahn ordered. Next available apt. for that test is mid - March. Patient states it is hard for her to wait that long due to her extreme pain in her legs and feet. Patient states that she cannot walk good and has bad back pain. Legs are numb and swell. She has to keep her feet propped up at work due to the swelling. She also states that she cannot keep her shoes on due to pain and swelling. Patient states that pain goes from inside her legs to feet and pain level is a 10. When describing the pain, she states it feels like needles are in her legs and feet including her toes. Patient states medication prescribed by Dr. Anne Hahn makes her sleepy and she prefers not to take it due to not wanting to feel sleepy / drowsy.She states that this pain has been going on for 3 months now and she can hardly tolerate it anymore. She states she cannot wait a month for the NCV / EMG test and would like to be worked in if possible for a sooner apt. Patient has requested for RN or MD to call her for advice since she is in desperate pain.

## 2018-08-24 NOTE — Telephone Encounter (Signed)
The patient is having ongoing discomfort, she is getting some transient benefit from the gabapentin, we will go up on the dose.  She will start 600 mg twice during the day and 900 mg at night.  If we get a cancellation with EMG, we will try to work her in.

## 2018-08-27 NOTE — Telephone Encounter (Signed)
I attempted to reach the pt and schedule the appt. Pt was not available and did not have a working vm at this time. Will try again at a later time.

## 2018-08-27 NOTE — Telephone Encounter (Signed)
I contacted the pt and advised on the EMG slot opened for today(08/24/18) at 12:45 pm with a check in time of 12:30. Pt states she is unable to make this appointment, but would like to be called again if another appointment opens up. (Preferablly Tues, Wed, or Thurs).

## 2018-08-27 NOTE — Telephone Encounter (Signed)
I have had a cancellation for this afternoon at 12:45p, patient arrive at 12:30p, if you would like to see if she can make it today?

## 2018-09-04 NOTE — Telephone Encounter (Signed)
I contacted the patient and advised we had an opening tomorrow at 3:15. Patient was agreeable to this appointment time and date and was advised to arrive by 2:45 pm.

## 2018-09-04 NOTE — Telephone Encounter (Signed)
We have an opening tomorrow afternoon at 3:15p, if you would like to see if patient can make it.

## 2018-09-05 ENCOUNTER — Encounter: Payer: Self-pay | Admitting: Neurology

## 2018-09-05 ENCOUNTER — Ambulatory Visit: Payer: Managed Care, Other (non HMO) | Admitting: Neurology

## 2018-09-05 ENCOUNTER — Ambulatory Visit (INDEPENDENT_AMBULATORY_CARE_PROVIDER_SITE_OTHER): Payer: Managed Care, Other (non HMO) | Admitting: Neurology

## 2018-09-05 DIAGNOSIS — R202 Paresthesia of skin: Secondary | ICD-10-CM

## 2018-09-05 DIAGNOSIS — G373 Acute transverse myelitis in demyelinating disease of central nervous system: Secondary | ICD-10-CM

## 2018-09-05 MED ORDER — DULOXETINE HCL 30 MG PO CPEP: 30.0000 mg | ORAL_CAPSULE | Freq: Every day | ORAL | 3 refills | Status: DC

## 2018-09-05 NOTE — Procedures (Signed)
     HISTORY:  Rebecca Oconnor is a 53 year old patient with a history of numbness up both legs up to the mid chest level with discomfort in both legs.  The patient has reduced reflexes in both lower extremities.  She is being evaluated for a possible neuropathy.  NERVE CONDUCTION STUDIES:  Nerve conduction studies were performed on the right upper extremity.  The distal motor latency for the right median nerve was prolonged with a normal motor amplitude.  The distal motor latency motor and motor amplitudes for the right ulnar nerve were normal.  The nerve conduction velocities for the right median and ulnar nerves were normal.  The sensory latency for the right median nerve was prolonged, normal for the right ulnar nerve.  The F-wave latency for the right ulnar nerve was normal.  Nerve conduction studies were performed on both lower extremities.  The distal motor latencies and motor amplitudes for the peroneal and posterior tibial nerves were normal bilaterally with slowing seen for the peroneal nerves bilaterally, borderline normal for the posterior tibial nerves bilaterally.  Prolongation of the left sural sensory latency was seen, normal on the right and normal for the peroneal nerves bilaterally.  The F-wave latencies for the posterior tibial nerves were prolonged on the right, normal on the left.  EMG STUDIES:  EMG study was performed on the right lower extremity:  The tibialis anterior muscle reveals 2 to 4K motor units with full recruitment. No fibrillations or positive waves were seen. The peroneus tertius muscle reveals 2 to 4K motor units with reduced recruitment. 2+ fibrillations and positive waves were seen. The medial gastrocnemius muscle reveals 1 to 3K motor units with slightly reduced recruitment. 1+ positive waves were seen. The vastus lateralis muscle reveals 2 to 4K motor units with full recruitment. No fibrillations or positive waves were seen. The iliopsoas muscle reveals 2 to  4K motor units with full recruitment. No fibrillations or positive waves were seen. The biceps femoris muscle (long head) reveals 2 to 4K motor units with full recruitment. No fibrillations or positive waves were seen. The lumbosacral paraspinal muscles were tested at 3 levels, and revealed no abnormalities of insertional activity at the upper level tested.  1+ positive waves were seen in the middle and lower levels.  There was good relaxation.   IMPRESSION:  Nerve conduction studies were performed on the right upper extremity and both lower extremities and shows evidence of mild right carpal tunnel syndrome and findings suggestive of a mild peripheral neuropathy.  EMG evaluation of the right lower extremity shows findings that are consistent with a mild acute S1 radiculopathy.  Clinical correlation is required.   Marlan Palau MD 09/05/2018 4:53 PM  [facility] [address] [place], [place] [address]  Phone [phone] Fax [phone]

## 2018-09-05 NOTE — Progress Notes (Signed)
Please refer to EMG and nerve conduction procedure note.  

## 2018-09-05 NOTE — Progress Notes (Addendum)
The patient comes back in for EMG nerve conduction study evaluation.  The patient continues to complain of numbness up to the mid chest level, dysesthesias in both legs below and above the knees.  Nerve conduction study suggests a mild peripheral neuropathy, evidence of right carpal tunnel syndrome is seen.  EMG shows a denervation pattern that could be consistent with a right S1 radiculopathy.  Prior blood work done is unremarkable exception of an elevated angiotensin-converting enzyme level, but the patient is a smoker.  Sarcoidosis could potentially explain all of her symptoms, I have recommended MRI of the brain, and if unremarkable consider lumbar puncture.  The patient is not clear that she wants to pursue this diagnostic plan.  The patient is amenable to getting further blood work today.  We will do this.  The patient will be placed on Cymbalta to help with the pain, she has to take gabapentin frequently to avoid discomfort.      MNC    Nerve / Sites Muscle Latency Ref. Amplitude Ref. Rel Amp Segments Distance Velocity Ref. Area    ms ms mV mV %  cm m/s m/s mVms  R Median - APB     Wrist APB 4.3 ?4.4 8.2 ?4.0 100 Wrist - APB 7   25.2     Upper arm APB 9.0  7.0  85.4 Upper arm - Wrist 23 49 ?49 25.8  R Ulnar - ADM     Wrist ADM 2.3 ?3.3 8.1 ?6.0 100 Wrist - ADM 7   29.4     B.Elbow ADM 6.2  7.5  92.2 B.Elbow - Wrist 22 56 ?49 29.3     A.Elbow ADM 8.0  7.2  95.9 A.Elbow - B.Elbow 10 55 ?49 29.8         A.Elbow - Wrist      L Peroneal - EDB     Ankle EDB 4.5 ?6.5 4.0 ?2.0 100 Ankle - EDB 9   15.8     Fib head EDB 11.8  3.2  78.2 Fib head - Ankle 31 42 ?44 16.3     Pop fossa EDB 14.2  3.5  112 Pop fossa - Fib head 10 42 ?44 15.6         Pop fossa - Ankle      R Peroneal - EDB     Ankle EDB 5.0 ?6.5 2.4 ?2.0 100 Ankle - EDB 9   8.6     Fib head EDB 12.3  1.9  78.9 Fib head - Ankle 31 43 ?44 6.5     Pop fossa EDB 14.7  2.2  114 Pop fossa - Fib head 10 42 ?44 7.1         Pop  fossa - Ankle      L Tibial - AH     Ankle AH 3.9 ?5.8 7.7 ?4.0 100 Ankle - AH 9   22.9     Pop fossa AH 13.4  6.6  85.3 Pop fossa - Ankle 39 41 ?41 18.9  R Tibial - AH     Ankle AH 3.9 ?5.8 9.6 ?4.0 100 Ankle - AH 9   19.5     Pop fossa AH 13.4  7.1  73.9 Pop fossa - Ankle 39 41 ?41 16.6                  SNC    Nerve / Sites Rec. Site Peak Lat Ref.  Amp Ref. Segments Distance Peak Diff Ref.  ms ms V V  cm ms ms  L Sural - Ankle (Calf)     Calf Ankle 4.6 ?4.4 2 ?6 Calf - Ankle 14    R Sural - Ankle (Calf)     Calf Ankle 4.0 ?4.4 4 ?6 Calf - Ankle 14    L Superficial peroneal - Ankle     Lat leg Ankle 3.6 ?4.4 6 ?6 Lat leg - Ankle 14    R Superficial peroneal - Ankle     Lat leg Ankle 3.8 ?4.4 7 ?6 Lat leg - Ankle 14    R Median, Ulnar - Transcarpal comparison     Median Palm Wrist 2.9 ?2.2 32 ?35 Median Palm - Wrist 8       Ulnar Palm Wrist 1.9 ?2.2 22 ?12 Ulnar Palm - Wrist 8          Median Palm - Ulnar Palm  0.9 ?0.4  R Median - Orthodromic (Dig II, Mid palm)     Dig II Wrist 3.9 ?3.4 6 ?10 Dig II - Wrist 13    R Ulnar - Orthodromic, (Dig V, Mid palm)     Dig V Wrist 2.7 ?3.1 5 ?5 Dig V - Wrist 38                     F  Wave    Nerve F Lat Ref.   ms ms  L Tibial - AH 55.9 ?56.0  R Tibial - AH 59.4 ?56.0  R Ulnar - ADM 30.6 ?32.0

## 2018-09-10 ENCOUNTER — Telehealth: Payer: Self-pay | Admitting: Neurology

## 2018-09-10 LAB — ANTI-MYELIN ASSOC GLYCOP IGG: Anti-Myelin Assoc Glycop IgG: <1:10 {titer}

## 2018-09-10 LAB — PAN-ANCA
ANCA Proteinase 3: <3.5 U/mL (ref 0.0–3.5)
Atypical pANCA: <1:20 {titer}
C-ANCA: <1:20 {titer}
Myeloperoxidase Ab: <9 U/mL (ref 0.0–9.0)
P-ANCA: <1:20 {titer}

## 2018-09-10 LAB — NEUROMYELITIS OPTICA AUTOAB, IGG: NMO IgG Autoantibodies: <1.5 U/mL (ref 0.0–3.0)

## 2018-09-10 LAB — PARANEOPLASTIC PROFILE 1
Neuronal Nuc Ab (Ri), IFA: <1:10 {titer}
Neuronal Nuclear (Hu) Antibody (IB): <1:10 {titer}
Purkinje Cell (Yo) Autoantobodies- IFA: <1:10 {titer}

## 2018-09-10 LAB — COPPER, SERUM: Copper: 113 ug/dL (ref 72–166)

## 2018-09-10 NOTE — Telephone Encounter (Signed)
Pt is requesting her lab results. Please advise.

## 2018-09-10 NOTE — Telephone Encounter (Signed)
Labs are still pending. Will address once resulted.

## 2018-09-11 ENCOUNTER — Telehealth: Payer: Self-pay

## 2018-09-11 NOTE — Telephone Encounter (Signed)
I contacted the pt and was able to review lab results. Pt verbalized understanding and had no further questions at this time.

## 2018-09-11 NOTE — Telephone Encounter (Signed)
-----   Message from York Spaniel, MD sent at 09/11/2018  7:20 AM EST -----  The blood work results are unremarkable. Please call the patient. ----- Message ----- From: Nell Range Lab Results In Sent: 09/06/2018   1:36 PM EST To: York Spaniel, MD

## 2018-09-25 ENCOUNTER — Encounter: Payer: Managed Care, Other (non HMO) | Admitting: Neurology

## 2018-09-28 ENCOUNTER — Other Ambulatory Visit: Payer: Self-pay | Admitting: Emergency Medicine

## 2018-09-28 ENCOUNTER — Other Ambulatory Visit: Payer: Self-pay

## 2018-09-28 DIAGNOSIS — E559 Vitamin D deficiency, unspecified: Secondary | ICD-10-CM

## 2018-11-06 ENCOUNTER — Telehealth: Payer: Self-pay

## 2018-11-06 NOTE — Telephone Encounter (Signed)
Called patient to do their pre-visit COVID screening.  Have you recently traveled internationally([place], [place], [place], [place], [place]) or within the [place] to a hotspot area([place], [place], [place], [place], [place])? no  Are you currently experiencing any of the following: fever, cough, SHOB, fatigue? no  Have you been in contact with anyone who has recently travelled? no  Have you been in contact with anyone who is experiencing fever, cough, SHOB, fatigue or been diagnosed with COVID  or works in or has recently visited a SNF? No  Patient states that she currently has her great-nephew with her on quarantine. She would like to reschedule b/c he is 8 & isn't able to stay at home and she knows that we aren't currently allowing visitors. I offered to change her in person appointment to a telephone visit & then we could make an in-person visit at a later date.

## 2018-11-07 ENCOUNTER — Ambulatory Visit (INDEPENDENT_AMBULATORY_CARE_PROVIDER_SITE_OTHER): Payer: Managed Care, Other (non HMO) | Admitting: Family Medicine

## 2018-11-07 ENCOUNTER — Other Ambulatory Visit: Payer: Self-pay

## 2018-11-07 DIAGNOSIS — M792 Neuralgia and neuritis, unspecified: Secondary | ICD-10-CM | POA: Diagnosis not present

## 2018-11-07 DIAGNOSIS — I10 Essential (primary) hypertension: Secondary | ICD-10-CM | POA: Diagnosis not present

## 2018-11-07 DIAGNOSIS — R202 Paresthesia of skin: Secondary | ICD-10-CM | POA: Diagnosis not present

## 2018-11-07 DIAGNOSIS — R200 Anesthesia of skin: Secondary | ICD-10-CM

## 2018-11-07 DIAGNOSIS — B009 Herpesviral infection, unspecified: Secondary | ICD-10-CM

## 2018-11-07 MED ORDER — VALSARTAN-HYDROCHLOROTHIAZIDE 320-25 MG PO TABS: 1.0000 | ORAL_TABLET | Freq: Every day | ORAL | 2 refills | Status: DC

## 2018-11-07 MED ORDER — GABAPENTIN 100 MG PO CAPS: 100.0000 mg | ORAL_CAPSULE | Freq: Four times a day (QID) | ORAL | 3 refills | Status: DC

## 2018-11-07 NOTE — Progress Notes (Signed)
.  Virtual Visit via Telephone Note  I connected with Rebecca Oconnor on 11/07/18 at  3:10 PM EDT by telephone and verified that I am speaking with the correct person using two identifiers.  Location: Patient: Located in her personal automobile during encounter today Provider: Located at primary care office    I discussed the limitations, risks, security and privacy concerns of performing an evaluation and management service by telephone and the availability of in person appointments. I also discussed with the patient that there may be a patient responsible charge related to this service. The patient expressed understanding and agreed to proceed.   History of Present Illness: Rebecca Oconnor no home monitoring of blood pressure. Reports adherence to blood pressure medications. Reports efforts to adhere to low sodium diet. She is a nonsmoker. Denies any episodes headaches, shortness of breath, or chest pain. Rebecca Oconnor was referred to neurology back in January after presenting to primary care office with complaints of paraesthesia of BLE and neuropathic pain. She was seen by Dr. Stephanie Acre, MD, [facility] neurology. She reports undergoing MRI of neck and spine along with nerve conductions studies. Studies were inconclusive in identifying the specific cause of symptoms and an MRI brain was recommended. Rebecca Oconnor reports that this caused her to become upset due to fear of cost of all of the MRI and medical tests she has undergone already. She continues to remain symptomatic of nerve pain and paraesthesias. She is currently taking Cymbalta and was prescribed a higher dose of Gabapentin by neurologist however reports she is severely sedated with Gabapentin 600 mg. She had left over 100 mg Gabapentin tablets and has been taking every 2-3 hours when symptoms are severe and this doesn't cause severe lethargy and drowsiness. Requesting a refill of Gabapentin 100 mg tablets. She is scheduled to follow-up with Dr. Anne Hahn on  11/22/18. She is planning to contact her insurance regarding out of pocket cost of MRI brain, however, realizes the importance of the study and plans to discuss further with Dr. Anne Hahn.   Assessment and Plan: 1. Essential hypertension -Uncertain of control.  -Will continue current dose of Amlodipine and Diovan-HCT -Return for face to face encounter with labs July.  2. Numbness and tingling of lower extremity 3. Neuropathic pain -Discontinued Gabapentin 600 mg -Start Gabapentin 100 mg take up to 5 times daily   4. HSV-2 infection -Refilled Valtrex  Follow Up Instructions: July HTN follow-up with CMP-address overdue health maintenance    I discussed the assessment and treatment plan with the patient. The patient was provided an opportunity to ask questions and all were answered. The patient agreed with the plan and demonstrated an understanding of the instructions.   The patient was advised to call back or seek an in-person evaluation if the symptoms worsen or if the condition fails to improve as anticipated.  I provided 20 minutes of non-face-to-face time during this encounter.   Joaquin Courts, FNP Primary Care at [facility] [address] [place] [address] [phone]: [phone]

## 2018-11-08 ENCOUNTER — Telehealth: Payer: Self-pay | Admitting: Family Medicine

## 2018-11-08 NOTE — Telephone Encounter (Signed)
Pt called in stated that the pharmacy wont relase the gabapentin (NEURONTIN) 100 MG capsule  ° medication until the 3rd of may until dr provides auth please follow up  °

## 2018-11-08 NOTE — Telephone Encounter (Signed)
Pt called in stated that the pharmacy wont relase the gabapentin (NEURONTIN) 100 MG capsule   medication until the 3rd of may until dr provides Rebecca Oconnor please follow up

## 2018-11-09 NOTE — Telephone Encounter (Signed)
Spoke with [facility] pharmacy who stated the RX was last filled on 10/20/18 for #120 and insurance will not pay for the refill until 11/11/18. Left VM for pt explaining this, she was given my direct extension to call back with any further questions or concerns.

## 2018-11-11 MED ORDER — GABAPENTIN 100 MG PO CAPS: 100.0000 mg | ORAL_CAPSULE | Freq: Four times a day (QID) | ORAL | 3 refills | Status: DC

## 2018-11-11 NOTE — Telephone Encounter (Signed)
Submitted authorization to refill Gabapentin early

## 2018-11-20 ENCOUNTER — Telehealth: Payer: Self-pay | Admitting: Neurology

## 2018-11-20 NOTE — Telephone Encounter (Signed)
Due to current COVID 19 pandemic, our office is severely reducing in office visits until further notice, in order to minimize the risk to our patients and healthcare providers.   Called patient to offer a virtual visit for her 5/14 appointment. Patient declined because she has had unsuccessful attempts with virtual visits recently. I then offered her an in-office visit which she also declined. Patient states she would like a telephone call, however. I informed patient that she will receive a call from RN prior to appointment to update chart, a call from front office staff, and then Dr. Anne Hahn will call.  Pt understands that although there may be some limitations with this type of visit, we will take all precautions to reduce any security or privacy concerns.  Pt understands that this will be treated like an in office visit and we will file with pt's insurance, and there may be a patient responsible charge related to this service.

## 2018-11-21 NOTE — Telephone Encounter (Signed)
I contacted the patient and was able to update meds, allergies PMH.  Best call back # [phone].

## 2018-11-22 ENCOUNTER — Ambulatory Visit (INDEPENDENT_AMBULATORY_CARE_PROVIDER_SITE_OTHER): Payer: Managed Care, Other (non HMO) | Admitting: Neurology

## 2018-11-22 ENCOUNTER — Encounter: Payer: Self-pay | Admitting: Neurology

## 2018-11-22 ENCOUNTER — Other Ambulatory Visit: Payer: Self-pay

## 2018-11-22 DIAGNOSIS — G5601 Carpal tunnel syndrome, right upper limb: Secondary | ICD-10-CM | POA: Diagnosis not present

## 2018-11-22 DIAGNOSIS — G603 Idiopathic progressive neuropathy: Secondary | ICD-10-CM

## 2018-11-22 DIAGNOSIS — G629 Polyneuropathy, unspecified: Secondary | ICD-10-CM

## 2018-11-22 HISTORY — DX: Carpal tunnel syndrome, right upper limb: G56.01

## 2018-11-22 HISTORY — DX: Polyneuropathy, unspecified: G62.9

## 2018-11-22 MED ORDER — DULOXETINE HCL 30 MG PO CPEP: 30.0000 mg | ORAL_CAPSULE | Freq: Every day | ORAL | 3 refills | Status: DC

## 2018-11-22 MED ORDER — GABAPENTIN 100 MG PO CAPS: ORAL_CAPSULE | ORAL | 1 refills | Status: DC

## 2018-11-22 NOTE — Progress Notes (Signed)
° ° ° °  Virtual Visit via Telephone Note  I connected with Rebecca Oconnor on 11/22/18 at  4:00 PM EDT by telephone and verified that I am speaking with the correct person using two identifiers.  Location: Patient: The patient is at home. Provider: The physician is in office.   I discussed the limitations, risks, security and privacy concerns of performing an evaluation and management service by telephone and the availability of in person appointments. I also discussed with the patient that there may be a patient responsible charge related to this service. The patient expressed understanding and agreed to proceed.   History of Present Illness: Rebecca Oconnor is a 53 year old right-handed black female with a history of sensory alterations that began in the feet and worked up gradually to include the entire legs and up on the body to the mid chest level.  The patient was evaluated for a possible transverse myelitis, MRI of the cervical and thoracic spine did not show any definite cervical spine abnormalities, there was a small residual central canal seen, no transverse myelitis.  The patient had EMG nerve conduction study that showed evidence of a peripheral neuropathy but she also had right carpal tunnel syndrome.  The patient has been treated with gabapentin which seemed to help some, she was given a prescription for Cymbalta but never started the medication.  The patient has had improvement in the sensory complaints, she now has numbness mainly in the legs below the knees, she has bilateral hand numbness, right greater than left that may be associated with the carpal tunnel syndrome.  She does have some mild gait instability, she has not had any falls, she denies issues controlling the bowels or the bladder.  The blood work showed an elevated angiotensin-converting enzyme level, but she is a smoker.  We had discussed previously getting MRI of the brain and potentially getting a lumbar puncture, but the  patient did not wish to pursue further evaluation at that time.  Currently, she is working from home, she does fairly well with the discomfort as long she takes the gabapentin, 100 mg capsules every 2 hours while awake and then she takes a 300 mg capsule at night.   Observations/Objective: On the telephone evaluation, the patient is alert and cooperative, speech is well enunciated, not aphasic or dysarthric.  Assessment and Plan: 1.  Peripheral neuropathy  2.  Right carpal tunnel syndrome  3.  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  We may consider pursuing the MRI of the brain at that time depending on how she is doing.  She seems to have improved some with her sensory symptoms.  She will go on the Cymbalta for the discomfort, a prescription for the 100 mg capsules of gabapentin was sent in.    Follow Up Instructions: 51-month follow-up with me.   I discussed the assessment and treatment plan with the patient. The patient was provided an opportunity to ask questions and all were answered. The patient agreed with the plan and demonstrated an understanding of the instructions.   The patient was advised to call back or seek an in-person evaluation if the symptoms worsen or if the condition fails to improve as anticipated.  I provided 25 minutes of non-face-to-face time during this encounter.   York Spaniel, MD

## 2018-12-12 ENCOUNTER — Telehealth: Payer: Self-pay | Admitting: Neurology

## 2018-12-12 NOTE — Telephone Encounter (Signed)
Called patient and LVM to schedule 2 month in person follow-up with Dr. Anne Hahn.

## 2019-01-21 ENCOUNTER — Other Ambulatory Visit: Payer: Self-pay

## 2019-01-21 MED ORDER — OMEPRAZOLE 20 MG PO CPDR: 20.0000 mg | DELAYED_RELEASE_CAPSULE | Freq: Every day | ORAL | 0 refills | Status: DC

## 2019-01-30 ENCOUNTER — Telehealth: Payer: Self-pay

## 2019-01-30 NOTE — Telephone Encounter (Signed)
Called patient to do their pre-visit COVID screening.  Call went to voicemail. Unable to do prescreening.  

## 2019-01-31 ENCOUNTER — Ambulatory Visit: Payer: Managed Care, Other (non HMO) | Admitting: Critical Care Medicine

## 2019-01-31 NOTE — Progress Notes (Deleted)
   Subjective:    Patient ID: Rebecca Oconnor, female    DOB: 1966/03/06, 53 y.o.   MRN: [id number]  53 y.o.F with HTN last seen 10/2018  Needs bp check,  cmp  1. Essential hypertension -Uncertain of control.  -Will continue current dose of Amlodipine and Diovan-HCT -Return for face to face encounter with labs July.  2. Numbness and tingling of lower extremity 3. Neuropathic pain -Discontinued Gabapentin 600 mg -Start Gabapentin 100 mg take up to 5 times daily   4. HSV-2 infection -Refilled Valtrex     Review of Systems     Objective:   Physical Exam        Assessment & Plan:

## 2019-02-06 ENCOUNTER — Other Ambulatory Visit: Payer: Self-pay | Admitting: Certified Nurse Midwife

## 2019-02-13 ENCOUNTER — Telehealth: Payer: Self-pay

## 2019-02-13 NOTE — Telephone Encounter (Signed)
Called patient to do their pre-visit COVID screening.  Patient states that she would need to reschedule her appointment. Appointment moved to 02/26/2019 @ 8:30 AM.

## 2019-02-14 ENCOUNTER — Ambulatory Visit: Payer: Managed Care, Other (non HMO) | Admitting: Critical Care Medicine

## 2019-02-14 ENCOUNTER — Other Ambulatory Visit: Payer: Self-pay | Admitting: Neurology

## 2019-02-25 ENCOUNTER — Telehealth: Payer: Self-pay

## 2019-02-25 NOTE — Telephone Encounter (Signed)
Called patient to do their pre-visit COVID screening.  Patient states that she needs to reschedule her appointment for tomorrow. Her nephew is doing Holiday representative & needs assistance with the computer. Moved appointment to 03/05/2019 @ 3:50 PM.

## 2019-02-26 ENCOUNTER — Ambulatory Visit: Payer: Managed Care, Other (non HMO) | Admitting: Internal Medicine

## 2019-03-04 ENCOUNTER — Other Ambulatory Visit: Payer: Self-pay | Admitting: Neurology

## 2019-03-04 ENCOUNTER — Telehealth: Payer: Self-pay

## 2019-03-04 NOTE — Telephone Encounter (Signed)
Called patient to do their pre-visit COVID screening.  Patient states that she needs to reschedule appointment. Appointment moved to 03/19/2019 at 4:10 pm

## 2019-03-05 ENCOUNTER — Ambulatory Visit: Payer: Managed Care, Other (non HMO) | Admitting: Internal Medicine

## 2019-03-13 ENCOUNTER — Other Ambulatory Visit: Payer: Self-pay | Admitting: Neurology

## 2019-03-13 MED ORDER — GABAPENTIN 300 MG PO CAPS: 300.0000 mg | ORAL_CAPSULE | Freq: Every day | ORAL | 1 refills | Status: DC

## 2019-03-13 NOTE — Telephone Encounter (Signed)
I reviewed the chart and rx for 300 mg is not currently on the pt's med list. I reviewed the last note and Dr. Jannifer Franklin recommended th pt take 300 mg capsule at night and 100 mg of gabapentin every 2 hours while awake.  Will confirm with MD if this is still correct before sending.

## 2019-03-13 NOTE — Telephone Encounter (Signed)
Pt states she gets Gabapentin 300mg  not the 100mg . Pt would like a refill sent to the [facility] on [address] of the 300mg . Please advise.

## 2019-03-15 ENCOUNTER — Telehealth: Payer: Self-pay

## 2019-03-15 NOTE — Telephone Encounter (Signed)
Called patient to do their pre-visit COVID screening.  Call went to voicemail. Unable to do prescreening.  

## 2019-03-19 ENCOUNTER — Ambulatory Visit: Payer: Managed Care, Other (non HMO)

## 2019-04-05 ENCOUNTER — Other Ambulatory Visit: Payer: Self-pay

## 2019-04-05 MED ORDER — OMEPRAZOLE 20 MG PO CPDR: 20.0000 mg | DELAYED_RELEASE_CAPSULE | Freq: Every day | ORAL | 0 refills | Status: DC

## 2019-04-10 ENCOUNTER — Ambulatory Visit: Payer: Managed Care, Other (non HMO) | Admitting: Neurology

## 2019-05-01 ENCOUNTER — Ambulatory Visit: Payer: Managed Care, Other (non HMO) | Admitting: Neurology

## 2019-05-04 ENCOUNTER — Other Ambulatory Visit: Payer: Self-pay | Admitting: Family Medicine

## 2019-05-22 ENCOUNTER — Ambulatory Visit: Payer: Self-pay | Admitting: Neurology

## 2019-06-03 ENCOUNTER — Other Ambulatory Visit: Payer: Self-pay | Admitting: Family Medicine

## 2019-06-03 ENCOUNTER — Telehealth: Payer: Self-pay | Admitting: Neurology

## 2019-06-03 MED ORDER — GABAPENTIN 300 MG PO CAPS: 300.0000 mg | ORAL_CAPSULE | Freq: Three times a day (TID) | ORAL | 1 refills | Status: DC

## 2019-06-03 NOTE — Telephone Encounter (Signed)
I called pt that gabapentin was sent to listed pharmacy. Pt verbalized understanding.

## 2019-06-03 NOTE — Telephone Encounter (Signed)
Patient called requesting a refill for her gabapentin 300mg  or 100mg .  Patient was advised she was given a 90 day qty for her 300 mg with a refill and patient advised she takes 300 mg about 3 times a day and takes the 100mg  about 3 times a day. Patient states she takes either or the 100 or the 300 3 times a day.   Pharmacy:[facility] on [address]  Please follow up

## 2019-06-03 NOTE — Telephone Encounter (Signed)
The prescription for gabapentin was sent in. 

## 2019-06-12 ENCOUNTER — Other Ambulatory Visit: Payer: Self-pay | Admitting: Neurology

## 2019-06-12 NOTE — Telephone Encounter (Signed)
Pt has called asking if the 300mg  of the gabapentin (NEURONTIN) can be called in.  Pt states the 100mg  is not helping.  Pt is asking for a call to discuss this request

## 2019-06-12 NOTE — Telephone Encounter (Signed)
I called pt that she was called on 06/03/2019 and Dr.WIllis sent 300mg  gabapentin to the pharmacy taking three times a day. Pt stated she call but was told it was not there. I advise pt to call pharmacy and advise them Dr Jannifer Franklin did send another rx at the 300mg  dosage. She verbalized understanding.

## 2019-06-25 ENCOUNTER — Ambulatory Visit: Payer: Managed Care, Other (non HMO) | Admitting: Certified Nurse Midwife

## 2019-07-02 ENCOUNTER — Inpatient Hospital Stay (HOSPITAL_COMMUNITY)
Admission: EM | Admit: 2019-07-02 | Discharge: 2019-07-04 | DRG: 683 | Disposition: A | Discharge status: Home / Self Care | Payer: Managed Care, Other (non HMO) | Source: Ambulatory Visit | Attending: Internal Medicine | Admitting: Internal Medicine

## 2019-07-02 ENCOUNTER — Emergency Department (HOSPITAL_COMMUNITY): Payer: Managed Care, Other (non HMO)

## 2019-07-02 ENCOUNTER — Other Ambulatory Visit: Payer: Self-pay

## 2019-07-02 DIAGNOSIS — E871 Hypo-osmolality and hyponatremia: Secondary | ICD-10-CM | POA: Diagnosis present

## 2019-07-02 DIAGNOSIS — E872 Acidosis: Secondary | ICD-10-CM | POA: Diagnosis present

## 2019-07-02 DIAGNOSIS — G629 Polyneuropathy, unspecified: Secondary | ICD-10-CM | POA: Diagnosis present

## 2019-07-02 DIAGNOSIS — E860 Dehydration: Secondary | ICD-10-CM | POA: Diagnosis not present

## 2019-07-02 DIAGNOSIS — F1010 Alcohol abuse, uncomplicated: Secondary | ICD-10-CM | POA: Diagnosis present

## 2019-07-02 DIAGNOSIS — F17210 Nicotine dependence, cigarettes, uncomplicated: Secondary | ICD-10-CM | POA: Diagnosis present

## 2019-07-02 DIAGNOSIS — K219 Gastro-esophageal reflux disease without esophagitis: Secondary | ICD-10-CM | POA: Diagnosis present

## 2019-07-02 DIAGNOSIS — B159 Hepatitis A without hepatic coma: Secondary | ICD-10-CM | POA: Diagnosis present

## 2019-07-02 DIAGNOSIS — N3090 Cystitis, unspecified without hematuria: Secondary | ICD-10-CM | POA: Diagnosis present

## 2019-07-02 DIAGNOSIS — E861 Hypovolemia: Secondary | ICD-10-CM | POA: Diagnosis present

## 2019-07-02 DIAGNOSIS — Z20828 Contact with and (suspected) exposure to other viral communicable diseases: Secondary | ICD-10-CM | POA: Diagnosis present

## 2019-07-02 DIAGNOSIS — I10 Essential (primary) hypertension: Secondary | ICD-10-CM | POA: Diagnosis present

## 2019-07-02 DIAGNOSIS — Z7982 Long term (current) use of aspirin: Secondary | ICD-10-CM | POA: Diagnosis not present

## 2019-07-02 DIAGNOSIS — Z8249 Family history of ischemic heart disease and other diseases of the circulatory system: Secondary | ICD-10-CM

## 2019-07-02 DIAGNOSIS — N179 Acute kidney failure, unspecified: Principal | ICD-10-CM | POA: Diagnosis present

## 2019-07-02 DIAGNOSIS — Z79899 Other long term (current) drug therapy: Secondary | ICD-10-CM | POA: Diagnosis not present

## 2019-07-02 DIAGNOSIS — B9689 Other specified bacterial agents as the cause of diseases classified elsewhere: Secondary | ICD-10-CM | POA: Diagnosis present

## 2019-07-02 DIAGNOSIS — E876 Hypokalemia: Secondary | ICD-10-CM | POA: Diagnosis present

## 2019-07-02 DIAGNOSIS — I959 Hypotension, unspecified: Secondary | ICD-10-CM | POA: Diagnosis present

## 2019-07-02 LAB — URINALYSIS, ROUTINE W REFLEX MICROSCOPIC
Bilirubin Urine: NEGATIVE
Glucose, UA: NEGATIVE mg/dL
Ketones, ur: NEGATIVE mg/dL
Nitrite: POSITIVE — AB
Protein, ur: 100 mg/dL — AB
Specific Gravity, Urine: 1.012 (ref 1.005–1.030)
WBC, UA: >50 WBC/hpf — ABNORMAL HIGH (ref 0–5)
pH: 5 (ref 5.0–8.0)

## 2019-07-02 LAB — LACTIC ACID, PLASMA: Lactic Acid, Venous: 2.7 mmol/L (ref 0.5–1.9)

## 2019-07-02 LAB — COMPREHENSIVE METABOLIC PANEL
ALT: 103 U/L — ABNORMAL HIGH (ref 0–44)
AST: 164 U/L — ABNORMAL HIGH (ref 15–41)
Albumin: 4.2 g/dL (ref 3.5–5.0)
Alkaline Phosphatase: 64 U/L (ref 38–126)
Anion gap: 21 — ABNORMAL HIGH (ref 5–15)
BUN: 24 mg/dL — ABNORMAL HIGH (ref 6–20)
CO2: 18 mmol/L — ABNORMAL LOW (ref 22–32)
Calcium: 10 mg/dL (ref 8.9–10.3)
Chloride: 87 mmol/L — ABNORMAL LOW (ref 98–111)
Creatinine, Ser: 5.19 mg/dL — ABNORMAL HIGH (ref 0.44–1.00)
GFR calc Af Amer: 10 mL/min — ABNORMAL LOW (ref >60)
GFR calc non Af Amer: 9 mL/min — ABNORMAL LOW (ref >60)
Glucose, Bld: 119 mg/dL — ABNORMAL HIGH (ref 70–99)
Potassium: 3.1 mmol/L — ABNORMAL LOW (ref 3.5–5.1)
Sodium: 126 mmol/L — ABNORMAL LOW (ref 135–145)
Total Bilirubin: 1.1 mg/dL (ref 0.3–1.2)
Total Protein: 7.6 g/dL (ref 6.5–8.1)

## 2019-07-02 LAB — CBC
HCT: 32.2 % — ABNORMAL LOW (ref 36.0–46.0)
Hemoglobin: 11 g/dL — ABNORMAL LOW (ref 12.0–15.0)
MCH: 31.3 pg (ref 26.0–34.0)
MCHC: 34.2 g/dL (ref 30.0–36.0)
MCV: 91.7 fL (ref 80.0–100.0)
Platelets: 315 10*3/uL (ref 150–400)
RBC: 3.51 MIL/uL — ABNORMAL LOW (ref 3.87–5.11)
RDW: 13.9 % (ref 11.5–15.5)
WBC: 7.3 10*3/uL (ref 4.0–10.5)
nRBC: 0.3 % — ABNORMAL HIGH (ref 0.0–0.2)

## 2019-07-02 LAB — SODIUM, URINE, RANDOM: Sodium, Ur: 17 mmol/L

## 2019-07-02 LAB — PROTIME-INR
INR: 1 (ref 0.8–1.2)
Prothrombin Time: 12.9 seconds (ref 11.4–15.2)

## 2019-07-02 LAB — TYPE AND SCREEN
ABO/RH(D): B POS
Antibody Screen: NEGATIVE

## 2019-07-02 LAB — D-DIMER, QUANTITATIVE: D-Dimer, Quant: 0.6 ug/mL-FEU — ABNORMAL HIGH (ref 0.00–0.50)

## 2019-07-02 LAB — PREGNANCY, URINE: Preg Test, Ur: NEGATIVE

## 2019-07-02 LAB — SARS CORONAVIRUS 2 (TAT 6-24 HRS): SARS Coronavirus 2: NEGATIVE

## 2019-07-02 LAB — AMMONIA: Ammonia: 28 umol/L (ref 9–35)

## 2019-07-02 LAB — ABO/RH: ABO/RH(D): B POS

## 2019-07-02 LAB — TSH: TSH: 2.791 u[IU]/mL (ref 0.350–4.500)

## 2019-07-02 MED ORDER — SODIUM CHLORIDE 0.9 % IV BOLUS (SEPSIS)
500.0000 mL | Freq: Once | INTRAVENOUS | Status: AC
  Administered 2019-07-02: 500 mL via INTRAVENOUS

## 2019-07-02 MED ORDER — ONDANSETRON HCL 4 MG/2ML IJ SOLN: 4.0000 mg | Freq: Four times a day (QID) | INTRAMUSCULAR | Status: DC | PRN

## 2019-07-02 MED ORDER — SODIUM BICARBONATE 650 MG PO TABS
1300.0000 mg | ORAL_TABLET | Freq: Two times a day (BID) | ORAL | Status: DC
  Administered 2019-07-02: 1300 mg via ORAL
  Filled 2019-07-02 (×4): qty 2

## 2019-07-02 MED ORDER — FOLIC ACID 1 MG PO TABS
1.0000 mg | ORAL_TABLET | Freq: Every day | ORAL | Status: DC
  Administered 2019-07-02 – 2019-07-04 (×3): 1 mg via ORAL
  Filled 2019-07-02 (×3): qty 1

## 2019-07-02 MED ORDER — POTASSIUM CHLORIDE CRYS ER 20 MEQ PO TBCR
40.0000 meq | EXTENDED_RELEASE_TABLET | Freq: Once | ORAL | Status: AC
  Administered 2019-07-02: 40 meq via ORAL
  Filled 2019-07-02: qty 2

## 2019-07-02 MED ORDER — ADULT MULTIVITAMIN W/MINERALS CH
1.0000 | ORAL_TABLET | Freq: Every day | ORAL | Status: DC
  Administered 2019-07-02 – 2019-07-04 (×3): 1 via ORAL
  Filled 2019-07-02 (×3): qty 1

## 2019-07-02 MED ORDER — THIAMINE HCL 100 MG PO TABS
100.0000 mg | ORAL_TABLET | Freq: Every day | ORAL | Status: DC
  Administered 2019-07-02 – 2019-07-04 (×3): 100 mg via ORAL
  Filled 2019-07-02 (×3): qty 1

## 2019-07-02 MED ORDER — LORAZEPAM 1 MG PO TABS: 1.0000 mg | ORAL_TABLET | ORAL | Status: DC | PRN

## 2019-07-02 MED ORDER — THIAMINE HCL 100 MG/ML IJ SOLN: 100.0000 mg | Freq: Every day | INTRAMUSCULAR | Status: DC

## 2019-07-02 MED ORDER — SODIUM CHLORIDE 0.9 % IV BOLUS
1000.0000 mL | Freq: Once | INTRAVENOUS | Status: AC
  Administered 2019-07-02: 1000 mL via INTRAVENOUS

## 2019-07-02 MED ORDER — LORAZEPAM 2 MG/ML IJ SOLN: 1.0000 mg | INTRAMUSCULAR | Status: DC | PRN

## 2019-07-02 MED ORDER — SODIUM CHLORIDE 0.9 % IV SOLN: INTRAVENOUS | Status: AC

## 2019-07-02 MED ORDER — SODIUM CHLORIDE 0.9 % IV SOLN
1000.0000 mL | INTRAVENOUS | Status: DC
  Administered 2019-07-02: 1000 mL via INTRAVENOUS

## 2019-07-02 MED ORDER — HEPARIN SODIUM (PORCINE) 5000 UNIT/ML IJ SOLN
5000.0000 [IU] | Freq: Three times a day (TID) | INTRAMUSCULAR | Status: DC
  Administered 2019-07-02 – 2019-07-04 (×5): 5000 [IU] via SUBCUTANEOUS
  Filled 2019-07-02 (×6): qty 1

## 2019-07-02 MED ORDER — SODIUM CHLORIDE 0.9 % IV SOLN
1.0000 g | INTRAVENOUS | Status: DC
  Administered 2019-07-03 (×2): 1 g via INTRAVENOUS
  Filled 2019-07-02: qty 1
  Filled 2019-07-02: qty 10
  Filled 2019-07-02: qty 1

## 2019-07-02 NOTE — H&P (Signed)
History and Physical  Rebecca Oconnor:[id number] DOB: [birth year] DOA: 07/02/2019  Referring physician: Dr. Donnald Garre, EDP  PCP: Bing Neighbors, FNP  Outpatient Specialists: [facility] Patient coming from: Home, referred by urgent care  Chief Complaint: Generalized weakness x 5 days.  HPI: Rebecca Oconnor is a 53 y.o. female with medical history significant for peripheral neuropathy on gabapentin, GERD, hypertension who presented to urgent care due to concern for generalized weakness and dizziness, stating she thought she had covid-19 viral infection.  Symptoms associated with increase in stool frequency and urinary frequency.  She went to urgent care to be tested for COVID-19 which was negative.  Blood work there revealed hyponatremia and elevated creatinine therefore was referred to [facility] ED.  Admits to nausea with no vomiting.  1 episode watery stool this morning.  Yesterday 3 bowel movements, watery.  No recent antibiotics.  No use of of laxative.  No fevers at home.  No abdominal cramping.  Had an endoscopy done 2 years ago which was unremarkable.  Sees her primary care provider yearly and has an upcoming appointment.  Denies any cardiopulmonary symptoms.  Admits to chronic tingling in her lower extremities bilaterally for which she takes gabapentin.  ED Course: On presentation to the ED at [facility], she is significantly hypotensive, improved with IV fluid boluses.  Lab studies remarkable for hyponatremia with sodium of 126, hypokalemia with potassium 3.1, elevated creatinine 5.19 with baseline creatinine of 0.9, chemistry bicarb 18, anion gap 21, elevated lactic acid 2.7 and elevated D-dimer 0.60.  TRH asked to admit due to electrolyte abnormalities.  COVID-19 test pending at the time of examination.  Review of Systems: Review of systems as noted in the HPI. All other systems reviewed and are negative.   Past Medical History:  Diagnosis Date  . 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 Hypertension   . Peripheral neuropathy 11/22/2018  . Right carpal tunnel syndrome 11/22/2018   Past Surgical History:  Procedure Laterality Date  . FINGER SURGERY     partial amputation    Social History:  reports that she has been smoking. She has never used smokeless tobacco. She reports current alcohol use of about 2.0 standard drinks of alcohol per week. She reports previous drug use.   No Known Allergies  Family History  Problem Relation Age of Onset  . Hypertension Mother   . Heart disease Mother   . Hypertension Father   . Hypertension Sister   . Hypertension Sister     Prior to Admission medications   Medication Sig Start Date End Date Taking? Authorizing Provider  amLODipine (NORVASC) 5 MG tablet TK 1 T PO QD Patient taking differently: Take 5 mg by mouth daily. TK 1 T PO QD 08/08/18  Yes Bing Neighbors, FNP  gabapentin (NEURONTIN) 300 MG capsule Take 1 capsule (300 mg total) by mouth 3 (three) times daily. 06/03/19  Yes York Spaniel, MD  Multiple Vitamins-Minerals (MULTIVITAMIN PO) Take by mouth.   Yes [provider]  omeprazole (PRILOSEC) 20 MG capsule TAKE 1 CAPSULE(20 MG) BY MOUTH DAILY Patient taking differently: Take 20 mg by mouth daily.  05/06/19  Yes Fulp, Cammie, MD  simethicone (MYLICON) 125 MG chewable tablet Chew 125 mg by mouth every 12 (twelve) hours as needed for flatulence.   Yes [provider]  valsartan-hydrochlorothiazide (DIOVAN-HCT) 320-25 MG tablet Take 1 tablet by mouth daily. 11/07/18  Yes Bing Neighbors,  FNP  DULoxetine (CYMBALTA) 30 MG capsule TAKE 1 CAPSULE(30 MG) BY MOUTH AT BEDTIME Patient not taking: Reported on 07/02/2019 02/14/19   York SpanielWillis, Charles K, MD    Physical Exam: BP 100/83   Pulse 99   Temp 98.1 F (36.7 C) (Oral)   Resp 18   SpO2 97%   . General: 53 y.o. year-old female well developed well nourished in no acute distress.  Alert and oriented  x3. . Cardiovascular: Regular rate and rhythm with no rubs or gallops.  No thyromegaly or JVD noted.  No lower extremity edema. 2/4 pulses in all 4 extremities. Marland Kitchen. Respiratory: Clear to auscultation with no wheezes or rales. Good inspiratory effort. . Abdomen: Soft mildly sore with palpation diffusely nondistended with normal bowel sounds x4 quadrants. . Muskuloskeletal: No cyanosis, clubbing or edema noted bilaterally . Neuro: CN II-XII intact, strength, sensation, reflexes . Skin: No ulcerative lesions noted or rashes . Psychiatry: Judgement and insight appear normal. Mood is appropriate for condition and setting          Labs on Admission:  Basic Metabolic Panel: Recent Labs  Lab 07/02/19 1440  NA 126*  K 3.1*  CL 87*  CO2 18*  GLUCOSE 119*  BUN 24*  CREATININE 5.19*  CALCIUM 10.0   Liver Function Tests: Recent Labs  Lab 07/02/19 1440  AST 164*  ALT 103*  ALKPHOS 64  BILITOT 1.1  PROT 7.6  ALBUMIN 4.2   No results for input(s): LIPASE, AMYLASE in the last 168 hours. Recent Labs  Lab 07/02/19 1525  AMMONIA 28   CBC: Recent Labs  Lab 07/02/19 1440  WBC 7.3  HGB 11.0*  HCT 32.2*  MCV 91.7  PLT 315   Cardiac Enzymes: No results for input(s): CKTOTAL, CKMB, CKMBINDEX, TROPONINI in the last 168 hours.  BNP (last 3 results) No results for input(s): BNP in the last 8760 hours.  ProBNP (last 3 results) No results for input(s): PROBNP in the last 8760 hours.  CBG: No results for input(s): GLUCAP in the last 168 hours.  Radiological Exams on Admission: DG Chest Port 1 View  Result Date: 07/02/2019 CLINICAL DATA:  Dizziness, weakness and hypotension today. EXAM: PORTABLE CHEST 1 VIEW COMPARISON:  PA and lateral chest 09/28/2017. FINDINGS: Minimal atelectasis is seen in the right lung base. Lungs are otherwise clear. Heart size is normal. No pneumothorax or pleural effusion. No acute or focal bony abnormality. IMPRESSION: No acute disease. Electronically  Signed   By: Drusilla Kannerhomas  Dalessio M.D.   On: 07/02/2019 16:07    EKG: I independently viewed the EKG done and my findings are as followed: Sinus tachycardia rate of 107.  No specific ST-T changes.  Assessment/Plan Present on Admission: . Hyponatremia  Active Problems:   Hyponatremia  Hypovolemic hyponatremia in the setting of poor oral intake Presented with sodium of 126 Received IV fluid boluses in the ED due to hypotension normal saline Repeat sodium level every 6 hours x1 day Continue normal saline at 75 cc/h Urine lites ordered and pending  AKI, suspect prerenal in the setting of poor oral intake with dehydration. Baseline creatinine appears to be 0.9 with GFR greater than 60 Presented with creatinine of 5.19 with GFR of 10 Continue IV fluid hydration Monitor urine output Avoid nephrotoxins If no improvement consider obtaining a renal ultrasound and consult nephrology Daily BMPs  High anion gap metabolic acidosis likely secondary to worsening renal function Presented with chemistry bicarb 18 and anion gap of 21 Started sodium bicarb 1300  mg twice daily x2 days Continue to monitor with daily BMPs  Sinus tachycardia likely secondary to intravascular volume depletion from dehydration Improved with IV fluid hydration Continue to monitor vital signs  Elevated AST ALT in the setting of daily alcohol use Monitor and repeat LFTs in the morning Avoid hepatotoxins Obtain acute hepatitis panel  Intermittent dizziness likely in the setting of dehydration Obtain orthostatic vital signs  Elevated D-dimer Presented with D-dimer 0.60 Not hypoxic and tachycardia has resolved with IV fluid hydration Continue to monitor  Alcohol use disorder Daily wine use CIWA protocol  Peripheral neuropathy Restart gabapentin on the lower dose.  DVT prophylaxis: Subcu heparin 3 times daily  Code Status: Full code per the patient herself.  Family Communication: None at  bedside.  Disposition Plan: Admit to MedSurg  Consults called: None  Admission status: Inpatient status.    Kayleen Memos MD [facility] Pager [phone]  If 7PM-7AM, please contact night-coverage [email or web address] Password [facility]  07/02/2019, 6:04 PM

## 2019-07-02 NOTE — ED Notes (Addendum)
Phlebotomy advised will draw labs. Pt aware of need for urine specimen. Pt states she needs to void then states she wants to finish her phone call.

## 2019-07-02 NOTE — ED Notes (Signed)
Order for cardiac monitoring d/c'd as per Dr Nevada Crane.

## 2019-07-02 NOTE — Progress Notes (Signed)
Positive UA with increase in urinary frequency, weakness. Obtained urine culture and started Rocephin empirically.  Can narrow down antibiotics once ID and sensitivities result.

## 2019-07-02 NOTE — ED Provider Notes (Signed)
[facility] EMERGENCY DEPARTMENT Provider Note   CSN: [id number] Arrival date & time: 07/02/19  1401     History No chief complaint on file.   Rebecca Oconnor is a 53 y.o. female.  HPI Patient reports she feels like she has been sick for about 5 days.  She reports she has had diarrhea.  Is been fairly frequent.  She reports sometimes it is looked dark and then she saw a little bit of blood which she assumed was due to frequent bowel movement.  No vomiting.  She reports she is had some generalized abdominal discomfort.  No fever.  Patient is not on any blood thinners.  She reports that she started getting so dizzy and lightheaded that she was just sitting in her rolling chair and moving herself around her house.  She reports she does work from home.  She reports this weekend she did actually collapsed to the ground 2 times because she was so dizzy when she stood up.  Reports she has continued to drink and eat.  She reports she has been taking in water to stay hydrated.  Patient reports she was thinking she might have Covid based on her symptoms.  She has not had significant cough or chest pain.  She reports she has had some generalized headache.  No nasal congestion.  [facility] and had a negative Covid test and was referred to the emergency department.  Patient reports she has had a problem for about a year with tingling in her feet and legs.  She reports sometimes there is a degree of weakness.  She reports that it impedes her walking.  She reports she has been very careful so she does not trip or fall.  Patient states that she was put on Neurontin for this condition.  She states that she has been taking higher and higher doses Neurontin due to progression of the symptoms.  Patient reports that she was diagnosed with possible multiple sclerosis last year describing scans on her body but not her head.  She states that her neurologist was going to  have her get an MRI in the next several weeks or months to further evaluate if she actually has multiple sclerosis.    Past Medical History:  Diagnosis Date  . HSV-2 (herpes simplex virus 2) infection    contracted at age 77, no reoccurence  . Hypertension   . Peripheral neuropathy 11/22/2018  . Right carpal tunnel syndrome 11/22/2018    Patient Active Problem List   Diagnosis Date Noted  . Peripheral neuropathy 11/22/2018  . Right carpal tunnel syndrome 11/22/2018    Past Surgical History:  Procedure Laterality Date  . FINGER SURGERY     partial amputation     OB History    Gravida  0   Para  0   Term  0   Preterm  0   AB  0   Living  0     SAB  0   TAB  0   Ectopic  0   Multiple  0   Live Births  0           Family History  Problem Relation Age of Onset  . Hypertension Mother   . Heart disease Mother   . Hypertension Father   . Hypertension Sister   . Hypertension Sister     Social History   Tobacco Use  . Smoking status: Current Every  Day Smoker  . Smokeless tobacco: Never Used  . Tobacco comment: 3 a day  Substance Use Topics  . Alcohol use: Yes    Alcohol/week: 2.0 standard drinks    Types: 2 Glasses of wine per week  . Drug use: Not Currently    Home Medications Prior to Admission medications   Medication Sig Start Date End Date Taking? Authorizing Provider  amLODipine (NORVASC) 5 MG tablet TK 1 T PO QD 08/08/18   Scot Jun, FNP  aspirin EC 81 MG tablet Take 81 mg by mouth daily.    [provider]  DULoxetine (CYMBALTA) 30 MG capsule TAKE 1 CAPSULE(30 MG) BY MOUTH AT BEDTIME 02/14/19   Kathrynn Ducking, MD  gabapentin (NEURONTIN) 300 MG capsule Take 1 capsule (300 mg total) by mouth 3 (three) times daily. 06/03/19   Kathrynn Ducking, MD  Multiple Vitamins-Minerals (MULTIVITAMIN PO) Take by mouth.    [provider]  omeprazole (PRILOSEC) 20 MG capsule TAKE 1 CAPSULE(20 MG) BY MOUTH DAILY 05/06/19   Fulp,  Cammie, MD  valsartan-hydrochlorothiazide (DIOVAN-HCT) 320-25 MG tablet Take 1 tablet by mouth daily. 11/07/18   Scot Jun, FNP    Allergies    Patient has no known allergies.  Review of Systems   Review of Systems 10 Systems reviewed and are negative for acute change except as noted in the HPI.  Physical Exam Updated Vital Signs BP (!) 84/65   Pulse 94   Temp 98.1 F (36.7 C) (Oral)   Resp 15   SpO2 98%   Physical Exam Constitutional:      Comments: Patient is alert and appropriate.  No distress.  She is clinically well in appearance.  HENT:     Head: Normocephalic and atraumatic.     Mouth/Throat:     Mouth: Mucous membranes are moist.     Pharynx: Oropharynx is clear.  Eyes:     Extraocular Movements: Extraocular movements intact.     Pupils: Pupils are equal, round, and reactive to light.  Cardiovascular:     Rate and Rhythm: Normal rate and regular rhythm.     Comments: Borderline tachycardia. Pulmonary:     Effort: Pulmonary effort is normal.     Breath sounds: Normal breath sounds.  Abdominal:     Comments: Abdomen is soft without guarding.  I do not appreciate any mass.  Patient endorses some diffuse central discomfort to palpation.  Musculoskeletal:        General: No swelling or tenderness. Normal range of motion.     Cervical back: Neck supple.     Right lower leg: No edema.     Left lower leg: No edema.     Comments: No peripheral edema.  Good condition of the lower legs and feet is very good.  Patient has good muscular development of the calves bilaterally.  Feet are warm and dry.  Skin:    General: Skin is warm and dry.  Neurological:     Comments: Patient is alert and appropriate with normal cognitive function and speech.  Nipples are 3 mm symmetric and responsive.  There appears to be consensual pupillary response.  Extract motions are normal. Lower extremities: Patient can elevate each extremity off of the bed and hold against resistance.  5\5  dorsiflexion plantarflexion against resistance.  Psychiatric:        Mood and Affect: Mood normal.     ED Results / Procedures / Treatments   Labs (all labs ordered are  listed, but only abnormal results are displayed) Labs Reviewed  COMPREHENSIVE METABOLIC PANEL  CBC  I-STAT BETA HCG BLOOD, ED ([facility], [facility], [facility] ONLY)  POC OCCULT BLOOD, ED  TYPE AND SCREEN    EKG EKG Interpretation  Date/Time:  Tuesday July 02 2019 14:08:59 EST Ventricular Rate:  107 PR Interval:  132 QRS Duration: 78 QT Interval:  336 QTC Calculation: 448 R Axis:   77 Text Interpretation: Sinus tachycardia Otherwise normal ECG Confirmed by Tilden Fossaees, Elizabeth [phone]([id number]) on 07/02/2019 2:22:12 PM   Radiology No results found.  Procedures Procedures (including critical care time) CRITICAL CARE Performed by: Arby BarretteMarcy Rilda Bulls   Total critical care time: 30 minutes  Critical care time was exclusive of separately billable procedures and treating other patients.  Critical care was necessary to treat or prevent imminent or life-threatening deterioration.  Critical care was time spent personally by me on the following activities: development of treatment plan with patient and/or surrogate as well as nursing, discussions with consultants, evaluation of patient's response to treatment, examination of patient, obtaining history from patient or surrogate, ordering and performing treatments and interventions, ordering and review of laboratory studies, ordering and review of radiographic studies, pulse oximetry and re-evaluation of patient's condition. Medications Ordered in ED Medications - No data to display  ED Course  I have reviewed the triage vital signs and the nursing notes.  Pertinent labs & imaging results that were available during my care of the patient were reviewed by me and considered in my medical decision making (see chart for details).    MDM Rules/Calculators/A&P                      Patient presents  aligned above.  She describes diarrhea has been persistent.  Patient denies fever.  She has had out side facility Covid test done today that was negative.  This would be [facility].  She denies she has been experiencing fevers.  Patient has had several syncopal episodes and extreme dizziness with standing.  Labs confirm acute renal failure.  I suspect dehydration as etiology.  Patient is also significantly hyponatremic.  Patient is alert and nontoxic.  Her mental status is clear.  I do not suspect septic etiology.  Will add stool test but do not think empiric antibiotics are indicated at this time.  Patient is also been taking increasing doses of Neurontin to try to alleviate feeling of tingling and weakness in her legs that has been chronic and progressive.  At this time she has no localizing weakness.  She can elevate her legs off the bed and hold against resistance.  I do not have suspicion for acute stroke.  Patient also advised that she drinks several glasses of wine per night and has done this for a long time.  Consideration for possible alcohol induced peripheral neuropathy.  She shows no signs of acute intoxication or withdrawal.  Patient does not have appearance of any decompensated alcoholism.  Patient will need admission for management of acute problems as outlined in further diagnostic evaluation. Final Clinical Impression(s) / ED Diagnoses Final diagnoses:  None    Rx / DC Orders ED Discharge Orders    None       Arby BarrettePfeiffer, Saori Umholtz, MD 07/02/19 [phone]

## 2019-07-02 NOTE — ED Notes (Signed)
Patient denies pain and is resting comfortably.  

## 2019-07-02 NOTE — ED Triage Notes (Signed)
Pt went to urgent care this morning for dizziness since Friday, received a negative covid test. Pt reports that her blood pressure was in the 70s so she was told to come here but had some things to get in order first so she drove herself here. Pt has generalized soreness in arms and legs. Endorses blood in stool since yesterday and diarrhea over the weekend. Passed out x 2 since Friday when she stands up quickly.

## 2019-07-03 ENCOUNTER — Encounter (HOSPITAL_COMMUNITY): Payer: Self-pay | Admitting: Internal Medicine

## 2019-07-03 DIAGNOSIS — E860 Dehydration: Secondary | ICD-10-CM

## 2019-07-03 DIAGNOSIS — N179 Acute kidney failure, unspecified: Secondary | ICD-10-CM

## 2019-07-03 LAB — BASIC METABOLIC PANEL
Anion gap: 14 (ref 5–15)
BUN: 22 mg/dL — ABNORMAL HIGH (ref 6–20)
CO2: 20 mmol/L — ABNORMAL LOW (ref 22–32)
Calcium: 8.9 mg/dL (ref 8.9–10.3)
Chloride: 96 mmol/L — ABNORMAL LOW (ref 98–111)
Creatinine, Ser: 4.55 mg/dL — ABNORMAL HIGH (ref 0.44–1.00)
GFR calc Af Amer: 12 mL/min — ABNORMAL LOW (ref >60)
GFR calc non Af Amer: 10 mL/min — ABNORMAL LOW (ref >60)
Glucose, Bld: 108 mg/dL — ABNORMAL HIGH (ref 70–99)
Potassium: 3.9 mmol/L (ref 3.5–5.1)
Sodium: 130 mmol/L — ABNORMAL LOW (ref 135–145)

## 2019-07-03 LAB — CBC
HCT: 27.3 % — ABNORMAL LOW (ref 36.0–46.0)
Hemoglobin: 9.4 g/dL — ABNORMAL LOW (ref 12.0–15.0)
MCH: 31.5 pg (ref 26.0–34.0)
MCHC: 34.4 g/dL (ref 30.0–36.0)
MCV: 91.6 fL (ref 80.0–100.0)
Platelets: 288 10*3/uL (ref 150–400)
RBC: 2.98 MIL/uL — ABNORMAL LOW (ref 3.87–5.11)
RDW: 14 % (ref 11.5–15.5)
WBC: 6.6 10*3/uL (ref 4.0–10.5)
nRBC: 0.3 % — ABNORMAL HIGH (ref 0.0–0.2)

## 2019-07-03 LAB — HEPATIC FUNCTION PANEL
ALT: 87 U/L — ABNORMAL HIGH (ref 0–44)
AST: 193 U/L — ABNORMAL HIGH (ref 15–41)
Albumin: 3.2 g/dL — ABNORMAL LOW (ref 3.5–5.0)
Alkaline Phosphatase: 52 U/L (ref 38–126)
Bilirubin, Direct: 0.2 mg/dL (ref 0.0–0.2)
Indirect Bilirubin: 0.4 mg/dL (ref 0.3–0.9)
Total Bilirubin: 0.6 mg/dL (ref 0.3–1.2)
Total Protein: 6.1 g/dL — ABNORMAL LOW (ref 6.5–8.1)

## 2019-07-03 LAB — PHOSPHORUS
Phosphorus: 1.1 mg/dL — ABNORMAL LOW (ref 2.5–4.6)
Phosphorus: 2.2 mg/dL — ABNORMAL LOW (ref 2.5–4.6)

## 2019-07-03 LAB — MAGNESIUM
Magnesium: 1.4 mg/dL — ABNORMAL LOW (ref 1.7–2.4)
Magnesium: 1.5 mg/dL — ABNORMAL LOW (ref 1.7–2.4)

## 2019-07-03 LAB — TSH: TSH: 3.268 u[IU]/mL (ref 0.350–4.500)

## 2019-07-03 LAB — LACTIC ACID, PLASMA: Lactic Acid, Venous: 1.9 mmol/L (ref 0.5–1.9)

## 2019-07-03 MED ORDER — GABAPENTIN 100 MG PO CAPS
100.0000 mg | ORAL_CAPSULE | Freq: Two times a day (BID) | ORAL | Status: DC
  Administered 2019-07-03 – 2019-07-04 (×3): 100 mg via ORAL
  Filled 2019-07-03 (×3): qty 1

## 2019-07-03 MED ORDER — MAGNESIUM SULFATE 4 GM/100ML IV SOLN
4.0000 g | Freq: Once | INTRAVENOUS | Status: AC
  Administered 2019-07-03: 4 g via INTRAVENOUS
  Filled 2019-07-03: qty 100

## 2019-07-03 MED ORDER — GABAPENTIN 300 MG PO CAPS: 300.0000 mg | ORAL_CAPSULE | Freq: Three times a day (TID) | ORAL | Status: DC

## 2019-07-03 MED ORDER — SODIUM CHLORIDE 0.9 % IV SOLN: INTRAVENOUS | Status: DC

## 2019-07-03 MED ORDER — POTASSIUM & SODIUM PHOSPHATES 280-160-250 MG PO PACK: 1.0000 | PACK | Freq: Three times a day (TID) | ORAL | Status: DC

## 2019-07-03 MED ORDER — MAGNESIUM OXIDE 400 (241.3 MG) MG PO TABS
400.0000 mg | ORAL_TABLET | Freq: Two times a day (BID) | ORAL | Status: DC
  Administered 2019-07-03 – 2019-07-04 (×3): 400 mg via ORAL
  Filled 2019-07-03 (×3): qty 1

## 2019-07-03 MED ORDER — SODIUM PHOSPHATES 45 MMOLE/15ML IV SOLN
30.0000 mmol | Freq: Once | INTRAVENOUS | Status: AC
  Administered 2019-07-03: 30 mmol via INTRAVENOUS
  Filled 2019-07-03: qty 10

## 2019-07-03 MED ORDER — K PHOS MONO-SOD PHOS DI & MONO 155-852-130 MG PO TABS
250.0000 mg | ORAL_TABLET | Freq: Three times a day (TID) | ORAL | Status: AC
  Administered 2019-07-03 – 2019-07-04 (×3): 250 mg via ORAL
  Filled 2019-07-03 (×3): qty 1

## 2019-07-03 NOTE — Progress Notes (Addendum)
Received pt here in 6N from ED, alert oriented x4, not in distress, skin intact with VSS. Oriented to room, bed controls and plan of care. Left in bed eating dinner with call bell at reach, will continue to monitor with remaining of shift.

## 2019-07-03 NOTE — Evaluation (Signed)
Physical Therapy Evaluation Patient Details Name: Rebecca Oconnor MRN: [id number] DOB: May 24, 1966 Today's Date: 07/03/2019   History of Present Illness  Pt is 53 y.o. female with medical history significant for peripheral neuropathy on gabapentin, GERD, hypertension who presented to urgent care due to concern for generalized weakness and dizziness.  Pt admitted with hyponatremia and hypotension  Clinical Impression  Pt was able to demonstrate safe gait and transfers.  She did not have any dizziness or hypotensive symptoms with PT.  She ambulated 200' without difficulty.  Pt at baseline function.  No further PT indicated.     Follow Up Recommendations No PT follow up    Equipment Recommendations  None recommended by PT    Recommendations for Other Services       Precautions / Restrictions Precautions Precautions: None      Mobility  Bed Mobility Overal bed mobility: Independent                Transfers Overall transfer level: Independent               General transfer comment: Pt has been ambulating to bathroom independently  Ambulation/Gait Ambulation/Gait assistance: Supervision Gait Distance (Feet): 200 Feet Assistive device: IV Pole Gait Pattern/deviations: WFL(Within Functional Limits)     General Gait Details: normal gait; did not need IV pole for balance; no episodes of dizziness; BP 97/67 post walking  Stairs            Wheelchair Mobility    Modified Rankin (Stroke Patients Only)       Balance Overall balance assessment: Independent                                           Pertinent Vitals/Pain Pain Assessment: No/denies pain    Home Living Family/patient expects to be discharged to:: Private residence Living Arrangements: Alone Available Help at Discharge: Family;Available PRN/intermittently Type of Home: House           Additional Comments: no DME    Prior Function Level of Independence: Independent          Comments: Typically very independent and working     Hand Dominance        Extremity/Trunk Assessment   Upper Extremity Assessment Upper Extremity Assessment: Overall WFL for tasks assessed    Lower Extremity Assessment Lower Extremity Assessment: Overall WFL for tasks assessed    Cervical / Trunk Assessment Cervical / Trunk Assessment: Normal  Communication   Communication: No difficulties  Cognition Arousal/Alertness: Awake/alert Behavior During Therapy: WFL for tasks assessed/performed Overall Cognitive Status: Within Functional Limits for tasks assessed                                        General Comments General comments (skin integrity, edema, etc.): no episodes of dizziness or weakness: discussed safety (slow transfers, AROM prior to standing, etc) with orthostatic hypotension    Exercises     Assessment/Plan    PT Assessment Patent does not need any further PT services  PT Problem List         PT Treatment Interventions      PT Goals (Current goals can be found in the Care Plan section)  Acute Rehab PT Goals Patient Stated Goal: home for Christmas PT Goal Formulation:  With patient Time For Goal Achievement: 07/03/19 Potential to Achieve Goals: Good    Frequency     Barriers to discharge        Co-evaluation               AM-PAC PT "6 Clicks" Mobility  Outcome Measure Help needed turning from your back to your side while in a flat bed without using bedrails?: None Help needed moving from lying on your back to sitting on the side of a flat bed without using bedrails?: None Help needed moving to and from a bed to a chair (including a wheelchair)?: None Help needed standing up from a chair using your arms (e.g., wheelchair or bedside chair)?: None Help needed to walk in hospital room?: None Help needed climbing 3-5 steps with a railing? : None 6 Click Score: 24    End of Session Equipment Utilized During  Treatment: Gait belt Activity Tolerance: Patient tolerated treatment well Patient left: in bed;with call bell/phone within reach Nurse Communication: Mobility status      Time: 6270-3500 PT Time Calculation (min) (ACUTE ONLY): 28 min   Charges:   PT Evaluation $PT Eval Low Complexity: 1 Low          Royetta Asal, PT Acute Rehab Services Pager [phone] [facility] [phone] [facility] [phone]   Rayetta Humphrey 07/03/2019, 4:44 PM

## 2019-07-03 NOTE — Progress Notes (Signed)
OT NOTE   Ot order received and noted order now discontinued. OT will discontinue follow this patient and no longer review chart.    Fleeta Emmer, OTR/L  Acute Rehabilitation Services Pager: [phone] Office: [phone] .

## 2019-07-03 NOTE — Progress Notes (Signed)
PROGRESS NOTE  Rebecca Oconnor ZOX:[id number] DOB: 11/23/1965 DOA: 07/02/2019 PCP: Bing NeighborsHarris, Kimberly S, FNP   LOS: 1 day   Brief narrative:  Rebecca Oconnor is a 53 y.o. female with medical history significant for peripheral neuropathy on gabapentin, GERD, hypertension who presented to urgent care due to concern for generalized weakness and dizziness, stating she thought she had covid-19 viral infection.  Symptoms associated with increase in stool frequency and urinary frequency.  She went to urgent care to be tested for COVID-19 which was negative.  Blood work there revealed hyponatremia and elevated creatinine therefore was referred to [facility]. ED Course: On presentation to the ED at [facility], she is significantly hypotensive, improved with IV fluid boluses.  Lab studies remarkable for hyponatremia with sodium of 126, hypokalemia with potassium 3.1, elevated creatinine 5.19 with baseline creatinine of 0.9, chemistry bicarb 18, anion gap 21, elevated lactic acid 2.7 and elevated D-dimer 0.60.  TRH asked to admit due to electrolyte abnormalities  Assessment/Plan:  Hypovolemic hyponatremia in the setting of poor oral intake Slight improvement after hydration.  Continue to hydrate.  Patient was encouraged to drink water.  Hepatitis A. IgM positive.  Diarrhea has improved.  Unable to obtain C. difficile screen due to solid stools.  We will continue to monitor.  Avoid hepatotoxic medications.  AKI, suspect prerenal in the setting of poor oral intake with dehydration. Baseline creatinine appears to be 0.9 with GFR greater than 60 Presented with creatinine of 5.19 with GFR of 10.  Creatinine of 4.5 today.  Mild improvement.  Will continue IV fluid hydration.  Change to monitor intake and output charting.  High anion gap metabolic acidosis likely secondary to worsening renal function Presented with chemistry bicarb 18 and anion gap of 21.  This has improved at this time.  Will discontinue sodium bicarb  tablets.  Sinus tachycardia likely secondary to intravascular volume depletion from dehydration Improved.  Continue hydration  Elevated AST ALT in the setting of daily alcohol use and hepatitis A. Avoid hepatotoxins.  Patient was counseled about alcohol cessation.  Monitor LFTs.  No encephalopathy.  INR in a.m.  Bilirubin within normal limits.  AST 193 and ALT 87 some mild improvement..  Intermittent dizziness likely in the setting of dehydration Patient.  Elevated D-dimer Nonspecific.  Unlikely PE.  Alcohol use disorder Daily wine use,CIWA protocol.  No signs of withdrawal at this time.  Peripheral neuropathy On gabapentin on the lower dose.  Abnormal UA, nausea vomiting.  Possible UTI.  Follow urine culture.  On Rocephin IV.  Continue with that for now.  Hypophosphatemia, hypomagnesemia.  Patient received phosphate and magnesium.  Will check phosphate and magnesium levels in a.m.   VTE Prophylaxis: Heparin subcu  Code Status: Full code  Family Communication: None  Disposition Plan: Home likely in 2 to 3 days.  Await improvement in renal function.  Get PT evaluation.   Consultants:  None  Procedures:  None  Antibiotics: Anti-infectives (From admission, onward)   Start     Dose/Rate Route Frequency Ordered Stop   07/02/19 2200  cefTRIAXone (ROCEPHIN) 1 g in sodium chloride 0.9 % 100 mL IVPB     1 g 200 mL/hr over 30 Minutes Intravenous Every 24 hours 07/02/19 2153       Subjective: Today, patient states that she did have a solid bowel movement.  No nausea vomiting.  Feels better than yesterday.  No abdominal pain.  Objective: Vitals:   07/02/19 2300 07/03/19 0554  BP: 94/60  93/68  Pulse: 91 93  Resp: 18 18  Temp: 98.2 F (36.8 C) 98 F (36.7 C)  SpO2: 98% 100%    Intake/Output Summary (Last 24 hours) at 07/03/2019 0756 Last data filed at 07/03/2019 0300 Gross per 24 hour  Intake 1941.92 ml  Output --  Net 1941.92 ml   Filed Weights    07/02/19 2300  Weight: 78 kg   Body mass index is 26.92 kg/m.   Physical Exam: GENERAL: Patient is alert awake and oriented. Not in obvious distress. HENT: No scleral pallor or icterus. Pupils equally reactive to light. Oral mucosa is moist NECK: is supple, no palpable thyroid enlargement. CHEST: Clear to auscultation. No crackles or wheezes. Non tender on palpation. Diminished breath sounds bilaterally. CVS: S1 and S2 heard, no murmur. Regular rate and rhythm. No pericardial rub. ABDOMEN: Soft, non-tender, bowel sounds are present. No palpable hepato-splenomegaly. EXTREMITIES: No edema. CNS: Cranial nerves are intact. No focal motor or sensory deficits. SKIN: warm and dry without rashes.  Data Review: I have personally reviewed the following laboratory data and studies,  CBC: Recent Labs  Lab 07/02/19 1440  WBC 7.3  HGB 11.0*  HCT 32.2*  MCV 91.7  PLT 315   Basic Metabolic Panel: Recent Labs  Lab 07/02/19 1440 07/02/19 2325  NA 126*  --   K 3.1*  --   CL 87*  --   CO2 18*  --   GLUCOSE 119*  --   BUN 24*  --   CREATININE 5.19*  --   CALCIUM 10.0  --   MG  --  1.4*  PHOS  --  1.1*   Liver Function Tests: Recent Labs  Lab 07/02/19 1440  AST 164*  ALT 103*  ALKPHOS 64  BILITOT 1.1  PROT 7.6  ALBUMIN 4.2   No results for input(s): LIPASE, AMYLASE in the last 168 hours. Recent Labs  Lab 07/02/19 1525  AMMONIA 28   Cardiac Enzymes: No results for input(s): CKTOTAL, CKMB, CKMBINDEX, TROPONINI in the last 168 hours. BNP (last 3 results) No results for input(s): BNP in the last 8760 hours.  ProBNP (last 3 results) No results for input(s): PROBNP in the last 8760 hours.  CBG: No results for input(s): GLUCAP in the last 168 hours. Recent Results (from the past 240 hour(s))  SARS CORONAVIRUS 2 (TAT 6-24 HRS) Nasopharyngeal Nasopharyngeal Swab     Status: None   Collection Time: 07/02/19  4:01 PM   Specimen: Nasopharyngeal Swab  Result Value Ref Range  Status   SARS Coronavirus 2 NEGATIVE NEGATIVE Final    Comment: (NOTE) SARS-CoV-2 target nucleic acids are NOT DETECTED. The SARS-CoV-2 RNA is generally detectable in upper and lower respiratory specimens during the acute phase of infection. Negative results do not preclude SARS-CoV-2 infection, do not rule out co-infections with other pathogens, and should not be used as the sole basis for treatment or other patient management decisions. Negative results must be combined with clinical observations, patient history, and epidemiological information. The expected result is Negative. Fact Sheet for Patients: HairSlick.no Fact Sheet for Healthcare Providers: quierodirigir.com This test is not yet approved or cleared by the [place] FDA and  has been authorized for detection and/or diagnosis of SARS-CoV-2 by FDA under an Emergency Use Authorization (EUA). This EUA will remain  in effect (meaning this test can be used) for the duration of the COVID-19 declaration under Section 56 4(b)(1) of the Act, 21 U.S.C. section 360bbb-3(b)(1), unless the authorization is terminated  or revoked sooner. Performed at [facility], [place] [address], [place], [place] [address]      Studies: DG Chest Port 1 View  Result Date: 07/02/2019 CLINICAL DATA:  Dizziness, weakness and hypotension today. EXAM: PORTABLE CHEST 1 VIEW COMPARISON:  PA and lateral chest 09/28/2017. FINDINGS: Minimal atelectasis is seen in the right lung base. Lungs are otherwise clear. Heart size is normal. No pneumothorax or pleural effusion. No acute or focal bony abnormality. IMPRESSION: No acute disease. Electronically Signed   By: Inge Rise M.D.   On: 07/02/2019 16:07    Scheduled Meds: . folic acid  1 mg Oral Daily  . gabapentin  100 mg Oral BID  . heparin injection (subcutaneous)  5,000 Units Subcutaneous Q8H  . multivitamin with minerals  1 tablet Oral  Daily  . sodium bicarbonate  1,300 mg Oral BID  . thiamine  100 mg Oral Daily   Or  . thiamine  100 mg Intravenous Daily    Continuous Infusions: . sodium chloride 75 mL/hr at 07/03/19 0554  . cefTRIAXone (ROCEPHIN)  IV Stopped (07/03/19 0037)  . magnesium sulfate bolus IVPB 4 g (07/03/19 0700)  . sodium phosphate  Dextrose 5% IVPB 30 mmol (07/03/19 0656)     Flora Lipps, MD  [facility] 07/03/2019

## 2019-07-03 NOTE — Social Work (Signed)
CSW received consult for substance use counseling.  Upon chart review of notes and lab results, as well as discussing pt care with RN do not feel this is appropriate at this time.   CSW signing off. Please consult if any additional needs arise.  Alexander Mt, [place] Work

## 2019-07-04 LAB — CBC
HCT: 25.6 % — ABNORMAL LOW (ref 36.0–46.0)
Hemoglobin: 8.9 g/dL — ABNORMAL LOW (ref 12.0–15.0)
MCH: 31.9 pg (ref 26.0–34.0)
MCHC: 34.8 g/dL (ref 30.0–36.0)
MCV: 91.8 fL (ref 80.0–100.0)
Platelets: 287 10*3/uL (ref 150–400)
RBC: 2.79 MIL/uL — ABNORMAL LOW (ref 3.87–5.11)
RDW: 14 % (ref 11.5–15.5)
WBC: 7 10*3/uL (ref 4.0–10.5)
nRBC: 0.6 % — ABNORMAL HIGH (ref 0.0–0.2)

## 2019-07-04 LAB — COMPREHENSIVE METABOLIC PANEL
ALT: 81 U/L — ABNORMAL HIGH (ref 0–44)
AST: 141 U/L — ABNORMAL HIGH (ref 15–41)
Albumin: 3.1 g/dL — ABNORMAL LOW (ref 3.5–5.0)
Alkaline Phosphatase: 59 U/L (ref 38–126)
Anion gap: 16 — ABNORMAL HIGH (ref 5–15)
BUN: 17 mg/dL (ref 6–20)
CO2: 17 mmol/L — ABNORMAL LOW (ref 22–32)
Calcium: 8.8 mg/dL — ABNORMAL LOW (ref 8.9–10.3)
Chloride: 101 mmol/L (ref 98–111)
Creatinine, Ser: 2.85 mg/dL — ABNORMAL HIGH (ref 0.44–1.00)
GFR calc Af Amer: 21 mL/min — ABNORMAL LOW (ref >60)
GFR calc non Af Amer: 18 mL/min — ABNORMAL LOW (ref >60)
Glucose, Bld: 116 mg/dL — ABNORMAL HIGH (ref 70–99)
Potassium: 3.2 mmol/L — ABNORMAL LOW (ref 3.5–5.1)
Sodium: 134 mmol/L — ABNORMAL LOW (ref 135–145)
Total Bilirubin: 0.2 mg/dL — ABNORMAL LOW (ref 0.3–1.2)
Total Protein: 5.6 g/dL — ABNORMAL LOW (ref 6.5–8.1)

## 2019-07-04 LAB — CALCIUM / CREATININE RATIO, URINE
Calcium, Ur: 1.4 mg/dL
Calcium/Creat.Ratio: 6 mg/g creat — ABNORMAL LOW (ref 29–442)
Creatinine, Urine: 237.6 mg/dL

## 2019-07-04 LAB — HEPATITIS PANEL, ACUTE
HCV Ab: NONREACTIVE
Hep A IgM: REACTIVE — AB
Hep B C IgM: NONREACTIVE
Hepatitis B Surface Ag: NONREACTIVE

## 2019-07-04 LAB — MAGNESIUM: Magnesium: 2.1 mg/dL (ref 1.7–2.4)

## 2019-07-04 LAB — PHOSPHORUS: Phosphorus: 2.6 mg/dL (ref 2.5–4.6)

## 2019-07-04 MED ORDER — OMEPRAZOLE 20 MG PO CPDR: 20.0000 mg | DELAYED_RELEASE_CAPSULE | Freq: Every day | ORAL | 2 refills | Status: DC

## 2019-07-04 MED ORDER — POTASSIUM CHLORIDE ER 10 MEQ PO TBCR: 10.0000 meq | EXTENDED_RELEASE_TABLET | Freq: Every day | ORAL | 0 refills | Status: DC

## 2019-07-04 MED ORDER — VALSARTAN-HYDROCHLOROTHIAZIDE 320-25 MG PO TABS: 1.0000 | ORAL_TABLET | Freq: Every day | ORAL | 2 refills | Status: DC

## 2019-07-04 MED ORDER — NITROFURANTOIN MONOHYD MACRO 100 MG PO CAPS: 100.0000 mg | ORAL_CAPSULE | Freq: Two times a day (BID) | ORAL | 0 refills | Status: AC

## 2019-07-04 MED ORDER — POTASSIUM CHLORIDE CRYS ER 20 MEQ PO TBCR
40.0000 meq | EXTENDED_RELEASE_TABLET | Freq: Once | ORAL | Status: AC
  Administered 2019-07-04: 40 meq via ORAL
  Filled 2019-07-04: qty 2

## 2019-07-04 NOTE — Progress Notes (Signed)
Rebecca Oconnor to be D/C'd  per MD order. Discussed with the patient and all questions fully answered.  VSS, Skin clean, dry and intact without evidence of skin break down, no evidence of skin tears noted.  IV catheter discontinued intact. Site without signs and symptoms of complications. Dressing and pressure applied.  An After Visit Summary was printed and given to the patient. Patient received prescription.  D/c education completed with patient/family including follow up instructions, medication list, d/c activities limitations if indicated, with other d/c instructions as indicated by MD - patient able to verbalize understanding, all questions fully answered.   Patient instructed to return to ED, call 911, or call MD for any changes in condition.   Patient to be escorted via [place], and D/C home via private auto.

## 2019-07-04 NOTE — Discharge Summary (Signed)
Physician Discharge Summary  Rebecca Oconnor CWC:[id number] DOB: [birth year] DOA: 07/02/2019  PCP: Bing Neighbors, FNP  Admit date: 07/02/2019 Discharge date: 07/04/2019  Admitted From: Home  Discharge disposition: home   Recommendations for Outpatient Follow-Up:    Follow up with your primary care provider as has been scheduled by you. Please check blood work at that time.   Discharge Diagnosis:   Principal Problem:   Hyponatremia Active Problems:   AKI (acute kidney injury) (HCC)   Hypomagnesemia   Hypophosphatemia   Discharge Condition: Improved.  Diet recommendation: Low sodium,   Wound care: None.  Code status: Full.   History of Present Illness:   Rebecca Oconnor a 53 y.o.femalewith medical history significant forperipheral neuropathy on gabapentin, GERD, hypertension who presented to urgent care due to concern for generalized weakness and dizziness, stating she thought she had covid-19 viral infection. Symptoms associated withincrease in stool frequency and urinary frequency. She went to urgent care to be tested forCOVID-19whichwas negative. Blood work thererevealed hyponatremia and elevated creatinine therefore [facility].ED Course:[facility], she issignificantly hypotensive, improved with IV fluid boluses. Lab studies remarkable for hyponatremia with sodium of 126, hypokalemia with potassium 3.1, elevated creatinine 5.19 with baseline creatinine of 0.9, chemistry bicarb 18,anion gap 21, elevated lactic acid 2.7 and elevated D-dimer 0.60. TRH was asked to admit due to electrolyte abnormalities   Hospital Course:   Following conditions were addressed during hospitalization, follow-up plan in italics.  Hypovolemic hyponatremia in the setting of poor oral intake Improved after hydration.  Check BMP with primary care physician.  Mild hypokalemia.  Oral potassium given, prescription on discharge.  Hepatitis  A. IgM positive.  Diarrhea has improved.  Unable to obtain C. difficile screen due to solid stools.    Patient was discussed about avoiding alcohol and Tylenol.  Patient will have to follow-up with her primary care physician as outpatient.  LFTs have improved while in hospital.  AKI, suspect prerenal in the setting of poor oral intake with dehydration and use of lisinopril hydrochlorothiazide. Baseline creatinine appears to be 0.9 with GFR greater than 60 Presented with creatinine of 5.19 with GFR of 10.  Creatinine of 2.8 today.    Moderately improved.   Patient is adamant about getting discharged today.  She was extensively explained the need of hydration and avoiding lisinopril HCTZ for next 3 to 4 days.  She will have to follow-up with her primary care physician as outpatient for electrolyte and renal function monitoring.  High anion gap metabolic acidosis likely secondary to AKI presented with chemistry bicarb 18 and anion gap of 21.    Improving.  Sinus tachycardia likely secondary to intravascular volume depletion from dehydration Improved.    Elevated AST ALT in the setting of daily alcohol use and hepatitis A. Avoid hepatotoxins.  Patient was counseled about alcohol cessation.    AST and ALT has improved.  Liver function test will need to be monitored with the primary care physician in the next visit.  Intermittent dizziness likely in the setting of dehydration Resolved  Alcohol use disorder Daily wine use, was on CIWA protocol.   There was no sign of withdrawal.  Patient was extensively counseled the need for quitting alcohol or have risk of hepatotoxicity  Peripheral neuropathy On gabapentin, will resume on discharge  Gram negative UTI.  Simple cystitis.   Received Rocephin IV, will continue nitrofurantoin on discharge complete the course  Hypophosphatemia, hypomagnesemia.  Patient received phosphate and  magnesium.    Improved and resolved.  Disposition.  Patient adamant  about going home though she was recommended 1 more day of continued hydration and electrolyte monitoring in the hospital.  She is aware of the risks of renal failure and electrolyte imbalance but wishes to strongly go home.  I have advised her to hold off with her lisinopril hydrochlorothiazide for the next few days and follow-up with your primary care physician which has already been scheduled by her.  She will need to be monitored for liver function test and renal function test.    Medical Consultants:    None.   Subjective:   Today, patient feels good.  Denies any nausea vomiting or abdominal pain.   has been tolerating oral diet.  No withdrawal symptoms.  Discharge Exam:   Vitals:   07/03/19 2109 07/04/19 0545  BP: 92/64 103/80  Pulse: 100 97  Resp: 18 18  Temp: 98.3 F (36.8 C) 98.2 F (36.8 C)  SpO2: 100% 96%   Vitals:   07/03/19 0554 07/03/19 1549 07/03/19 2109 07/04/19 0545  BP: 93/68 (!) 97/96 92/64 103/80  Pulse: 93 83 100 97  Resp: 18 18 18 18   Temp: 98 F (36.7 C) 98.9 F (37.2 C) 98.3 F (36.8 C) 98.2 F (36.8 C)  TempSrc: Oral Oral Oral Oral  SpO2: 100% 99% 100% 96%  Weight:      Height:        General exam: Appears calm and comfortable ,Not in distress, obese HEENT:PERRL,Oral mucosa moist Respiratory system: Bilateral equal air entry, normal vesicular breath sounds, no wheezes or crackles  Cardiovascular system: S1 & S2 heard, RRR.  Gastrointestinal system: Abdomen is nondistended, soft and nontender. No organomegaly or masses felt. Normal bowel sounds heard. Central nervous system: Alert and oriented. No focal neurological deficits. Extremities: No edema, no clubbing ,no cyanosis, distal peripheral pulses palpable. Skin: No rashes, lesions or ulcers,no icterus ,no pallor MSK: Normal muscle bulk,tone ,power    Procedures:    None  The results of significant diagnostics from this hospitalization (including imaging, microbiology, ancillary and  laboratory) are listed below for reference.     Diagnostic Studies:   DG Chest Port 1 View  Result Date: 07/02/2019 CLINICAL DATA:  Dizziness, weakness and hypotension today. EXAM: PORTABLE CHEST 1 VIEW COMPARISON:  PA and lateral chest 09/28/2017. FINDINGS: Minimal atelectasis is seen in the right lung base. Lungs are otherwise clear. Heart size is normal. No pneumothorax or pleural effusion. No acute or focal bony abnormality. IMPRESSION: No acute disease. Electronically Signed   By: Drusilla Kanner M.D.   On: 07/02/2019 16:07     Labs:   Basic Metabolic Panel: Recent Labs  Lab 07/02/19 1440 07/02/19 2325 07/03/19 0713 07/04/19 0341  NA 126*  --  130* 134*  K 3.1*  --  3.9 3.2*  CL 87*  --  96* 101  CO2 18*  --  20* 17*  GLUCOSE 119*  --  108* 116*  BUN 24*  --  22* 17  CREATININE 5.19*  --  4.55* 2.85*  CALCIUM 10.0  --  8.9 8.8*  MG  --  1.4* 1.5* 2.1  PHOS  --  1.1* 2.2* 2.6   GFR Estimated Creatinine Clearance: 25.1 mL/min (A) (by C-G formula based on SCr of 2.85 mg/dL (H)). Liver Function Tests: Recent Labs  Lab 07/02/19 1440 07/03/19 0713 07/04/19 0341  AST 164* 193* 141*  ALT 103* 87* 81*  ALKPHOS 64 52 59  BILITOT 1.1 0.6 0.2*  PROT 7.6 6.1* 5.6*  ALBUMIN 4.2 3.2* 3.1*   No results for input(s): LIPASE, AMYLASE in the last 168 hours. Recent Labs  Lab 07/02/19 1525  AMMONIA 28   Coagulation profile Recent Labs  Lab 07/02/19 1524  INR 1.0    CBC: Recent Labs  Lab 07/02/19 1440 07/03/19 0713 07/04/19 0341  WBC 7.3 6.6 7.0  HGB 11.0* 9.4* 8.9*  HCT 32.2* 27.3* 25.6*  MCV 91.7 91.6 91.8  PLT 315 288 287   Cardiac Enzymes: No results for input(s): CKTOTAL, CKMB, CKMBINDEX, TROPONINI in the last 168 hours. BNP: Invalid input(s): POCBNP CBG: No results for input(s): GLUCAP in the last 168 hours. D-Dimer Recent Labs    07/02/19 1524  DDIMER 0.60*   Hgb A1c No results for input(s): HGBA1C in the last 72 hours. Lipid Profile No  results for input(s): CHOL, HDL, LDLCALC, TRIG, CHOLHDL, LDLDIRECT in the last 72 hours. Thyroid function studies Recent Labs    07/03/19 0713  TSH 3.268   Anemia work up No results for input(s): VITAMINB12, FOLATE, FERRITIN, TIBC, IRON, RETICCTPCT in the last 72 hours. Microbiology Recent Results (from the past 240 hour(s))  SARS CORONAVIRUS 2 (TAT 6-24 HRS) Nasopharyngeal Nasopharyngeal Swab     Status: None   Collection Time: 07/02/19  4:01 PM   Specimen: Nasopharyngeal Swab  Result Value Ref Range Status   SARS Coronavirus 2 NEGATIVE NEGATIVE Final    Comment: (NOTE) SARS-CoV-2 target nucleic acids are NOT DETECTED. The SARS-CoV-2 RNA is generally detectable in upper and lower respiratory specimens during the acute phase of infection. Negative results do not preclude SARS-CoV-2 infection, do not rule out co-infections with other pathogens, and should not be used as the sole basis for treatment or other patient management decisions. Negative results must be combined with clinical observations, patient history, and epidemiological information. The expected result is Negative. Fact Sheet for Patients: HairSlick.no Fact Sheet for Healthcare Providers: quierodirigir.com This test is not yet approved or cleared by the [place] FDA and  has been authorized for detection and/or diagnosis of SARS-CoV-2 by FDA under an Emergency Use Authorization (EUA). This EUA will remain  in effect (meaning this test can be used) for the duration of the COVID-19 declaration under Section 56 4(b)(1) of the Act, 21 U.S.C. section 360bbb-3(b)(1), unless the authorization is terminated or revoked sooner. Performed at [facility], [address], [place], [place] [address]   Culture, Urine     Status: Abnormal (Preliminary result)   Collection Time: 07/02/19  9:54 PM   Specimen: Urine, Clean Catch  Result Value Ref Range Status    Specimen Description URINE, CLEAN CATCH  Final   Special Requests Normal  Final   Culture (A)  Final    >=100,000 COLONIES/mL GRAM NEGATIVE RODS IDENTIFICATION AND SUSCEPTIBILITIES TO FOLLOW Performed at [facility], [address], [place], [place] [address]    Report Status PENDING  Incomplete  Culture, blood (routine x 2)     Status: None (Preliminary result)   Collection Time: 07/02/19 11:25 PM   Specimen: BLOOD  Result Value Ref Range Status   Specimen Description BLOOD LEFT HAND  Final   Special Requests   Final    BOTTLES DRAWN AEROBIC AND ANAEROBIC Blood Culture adequate volume   Culture   Final    NO GROWTH 1 DAY Performed at [facility], [address], [place], [place] [address]    Report Status  PENDING  Incomplete  Culture, blood (routine x 2)     Status: None (Preliminary result)   Collection Time: 07/02/19 11:25 PM   Specimen: BLOOD  Result Value Ref Range Status   Specimen Description BLOOD RIGHT ARM  Final   Special Requests   Final    BOTTLES DRAWN AEROBIC AND ANAEROBIC Blood Culture adequate volume   Culture   Final    NO GROWTH 1 DAY Performed at [facility], [address] [place], [place] [address]    Report Status PENDING  Incomplete     Discharge Instructions:   Discharge Instructions    Diet - low sodium heart healthy   Complete by: As directed    Discharge instructions   Complete by: As directed    Increase fluid intake for the next 3 to 4 days.  No alcohol, smoking or use of Tylenol/ Motrin.  Follow-up with your primary care physician as has been scheduled by you.  Will need to check blood work at that time.  Please do not take your blood pressure medication until 07/08/2019.   Increase activity slowly   Complete by: As directed      Allergies as of 07/04/2019   No Known Allergies     Medication List    STOP taking these medications   DULoxetine 30 MG capsule Commonly known as: CYMBALTA     TAKE these  medications   amLODipine 5 MG tablet Commonly known as: NORVASC TK 1 T PO QD What changed:   how much to take  how to take this  when to take this   gabapentin 300 MG capsule Commonly known as: NEURONTIN Take 1 capsule (300 mg total) by mouth 3 (three) times daily.   MULTIVITAMIN PO Take by mouth.   nitrofurantoin (macrocrystal-monohydrate) 100 MG capsule Commonly known as: Macrobid Take 1 capsule (100 mg total) by mouth 2 (two) times daily for 5 days.   omeprazole 20 MG capsule Commonly known as: PRILOSEC Take 1 capsule (20 mg total) by mouth daily. What changed: See the new instructions.   potassium chloride 10 MEQ tablet Commonly known as: KLOR-CON Take 1 tablet (10 mEq total) by mouth daily.   simethicone 125 MG chewable tablet Commonly known as: MYLICON Chew 125 mg by mouth every 12 (twelve) hours as needed for flatulence.   valsartan-hydrochlorothiazide 320-25 MG tablet Commonly known as: DIOVAN-HCT Take 1 tablet by mouth daily. Start taking on: July 08, 2019 What changed: These instructions start on July 08, 2019. If you are unsure what to do until then, ask your doctor or other care provider.         Time coordinating discharge: 39 minutes  Signed:  Johanny Segers  [facility] 07/04/2019, 9:41 AM

## 2019-07-05 LAB — URINE CULTURE
Culture: >=100000 — AB
Special Requests: NORMAL

## 2019-07-08 LAB — CULTURE, BLOOD (ROUTINE X 2)
Culture: NO GROWTH
Culture: NO GROWTH
Special Requests: ADEQUATE
Special Requests: ADEQUATE

## 2019-07-15 NOTE — Progress Notes (Signed)
Virtual Visit via Video Note  I connected with Rebecca Oconnor on 07/16/19 at  7:45 AM EST by a video enabled telemedicine application and verified that I am speaking with the correct person using two identifiers.  Location: Patient: at her home Provider: in the office    I discussed the limitations of evaluation and management by telemedicine and the availability of in person appointments. The patient expressed understanding and agreed to proceed.  History of Present Illness: 07/16/2019 SS: Ms. Minkler is a 54 year old female with history of sensory alterations that began in the feet and gradually worked up to include the entire legs and up the body to the mid chest level.  She remains on gabapentin.  She was hospitalized, and discharged 07/04/2019 for hyponatremia, and elevated creatinine.  Previously, she has had EMG nerve conduction evaluation that suggested a mild peripheral neuropathy, evidence of right carpal tunnel syndrome, a denervation pattern that could be consistent with a right S1 radiculopathy.  Blood work has revealed an elevated angiotensin-converting enzyme, but she is a smoker.  She reports since Sunday, she describes a sensation of her legs tightening up, cramping.  She says she will be fine for 15 minutes then will develop a tightening sensation.  She also has swelling in her ankles.  She continues to complain of burning, stinging, and weakness in her legs.  She no longer complains of numbness.  She says the sensation goes up the back of her legs. She does not think it goes to her back or chest.  She was discharged home from the hospital with potassium, she has completed this.  She says she has been drinking plenty of water, and that her urine is clear.  She has been taking extra gabapentin, 600 mg 4 times a day.  She denies any urinary or bowel incontinence.  She continues to work from home, is elevating her legs.  She reports gait stability, does not have to use assistive device,  but is walking for longer than 15 minutes develops sensory alteration.  She presents today for evaluation via telephone visit.  11/22/2018 Dr. Jannifer Franklin: [address] [place] is a 54 year old right-handed black female with a history of sensory alterations that began in the feet and worked up gradually to include the entire legs and up on the body to the mid chest level.  The patient was evaluated for a possible transverse myelitis, MRI of the cervical and thoracic spine did not show any definite cervical spine abnormalities, there was a small residual central canal seen, no transverse myelitis.  The patient had EMG nerve conduction study that showed evidence of a peripheral neuropathy but she also had right carpal tunnel syndrome.  The patient has been treated with gabapentin which seemed to help some, she was given a prescription for Cymbalta but never started the medication.  The patient has had improvement in the sensory complaints, she now has numbness mainly in the legs below the knees, she has bilateral hand numbness, right greater than left that may be associated with the carpal tunnel syndrome.  She does have some mild gait instability, she has not had any falls, she denies issues controlling the bowels or the bladder.  The blood work showed an elevated angiotensin-converting enzyme level, but she is a smoker.  We had discussed previously getting MRI of the brain and potentially getting a lumbar puncture, but the patient did not wish to pursue further evaluation at that time.  Currently, she is working from home, she  does fairly well with the discomfort as long she takes the gabapentin, 100 mg capsules every 2 hours while awake and then she takes a 300 mg capsule at night.   Observations/Objective: Via telephone visit, could not connect via My chart   Assessment and Plan: 1.  Peripheral neuropathy 2.  Sensory alterations, previously up to the T5 level 3.  Recent hospitalization, discharged 07/04/2019 with  elevated creatinine, electrolyte abnormalities  She continues to complain of weakness, however not numbness in her lower extremities, up the backs of her legs.  She is now complaining of a tightening sensation, spasms in her legs, need to rule out electrolyte abnormality.  She is going to see her PCP for hospital follow-up tomorrow, they will check lab work including kidney function, electrolytes, liver function.  If this lab work cannot be done, she will let me know, I can assist in getting done at our office.  Her last creatinine 07/04/2019 was 2.85, K 3.2, AST 141, ALT 81.  She was switched off lisinopril hydrochlorothiazide to valsartan.  I have advised her to take gabapentin as prescribed, need to make sure extra gabapentin in the setting of poor kidney function isn't the cause of the muscle jerks, but she says gabapentin is very helpful.  I will proceed with MRI of the brain for continued work-up of sensory alteration, will evaluate lab work, to determine if contrast can be used.  Previously she has been evaluated for possible transverse myelitis, MRI of the cervical and thoracic spine did not show any definitive cervical spine abnormalities, there was a small residual central canal seen, no transverse myelitis.  I will see her back in the office for face-to-face visit in 4 weeks.   Follow Up Instructions: 4 weeks, 08/20/2019   I discussed the assessment and treatment plan with the patient. The patient was provided an opportunity to ask questions and all were answered. The patient agreed with the plan and demonstrated an understanding of the instructions.   The patient was advised to call back or seek an in-person evaluation if the symptoms worsen or if the condition fails to improve as anticipated.  I provided 28 minutes of non-face-to-face time during this encounter.  Otila Kluver, DNP  [facility] [address], [address] [place], [place] [address] [phone]

## 2019-07-16 ENCOUNTER — Encounter: Payer: Self-pay | Admitting: Neurology

## 2019-07-16 ENCOUNTER — Telehealth (INDEPENDENT_AMBULATORY_CARE_PROVIDER_SITE_OTHER): Payer: No Typology Code available for payment source | Admitting: Neurology

## 2019-07-16 ENCOUNTER — Other Ambulatory Visit: Payer: Self-pay

## 2019-07-16 DIAGNOSIS — R202 Paresthesia of skin: Secondary | ICD-10-CM | POA: Diagnosis not present

## 2019-07-16 DIAGNOSIS — G603 Idiopathic progressive neuropathy: Secondary | ICD-10-CM | POA: Diagnosis not present

## 2019-07-16 NOTE — Progress Notes (Signed)
I have read the note, and I agree with the clinical assessment and plan.  Rebecca Oconnor K Rebecca Oconnor   

## 2019-07-17 ENCOUNTER — Telehealth: Payer: Self-pay | Admitting: Family Medicine

## 2019-07-17 ENCOUNTER — Other Ambulatory Visit: Payer: Self-pay

## 2019-07-17 ENCOUNTER — Ambulatory Visit: Payer: No Typology Code available for payment source | Admitting: Certified Nurse Midwife

## 2019-07-17 ENCOUNTER — Encounter: Payer: Self-pay | Admitting: Certified Nurse Midwife

## 2019-07-17 VITALS — BP 100/60 | HR 64 | Temp 97.0°F | Resp 16 | Ht 66.25 in | Wt 173.0 lb

## 2019-07-17 DIAGNOSIS — Z113 Encounter for screening for infections with a predominantly sexual mode of transmission: Secondary | ICD-10-CM | POA: Diagnosis not present

## 2019-07-17 DIAGNOSIS — Z0000 Encounter for general adult medical examination without abnormal findings: Secondary | ICD-10-CM

## 2019-07-17 DIAGNOSIS — Z01419 Encounter for gynecological examination (general) (routine) without abnormal findings: Secondary | ICD-10-CM | POA: Diagnosis not present

## 2019-07-17 LAB — POCT URINALYSIS DIPSTICK
Bilirubin, UA: NEGATIVE
Blood, UA: NEGATIVE
Glucose, UA: NEGATIVE
Ketones, UA: NEGATIVE
Leukocytes, UA: NEGATIVE
Nitrite, UA: NEGATIVE
Protein, UA: NEGATIVE
Urobilinogen, UA: NEGATIVE E.U./dL — AB
pH, UA: 5 (ref 5.0–8.0)

## 2019-07-17 NOTE — Telephone Encounter (Signed)
Pt called asking if she could schedule an appt with Dr. Mardelle Matte. Would be a new pt appt. Dr. Mardelle Matte does not have any appts available until March. Pt wanted me to ask if Dr. Mardelle Matte would see her earlier due to just getting out of the hospital. Please advise.

## 2019-07-17 NOTE — Progress Notes (Signed)
54 y.o. G0P0000 Single  African American Fe here for annual exam. Menopausal no HRT. Denies vaginal bleeding or vaginal dryness. Last sexually active before Covid and not since that today. Desires vaginal screening only for STD. Patient recently seen in hospital for BP issues, fatigue and vertigo, and pedal edema. Was told to establish with PCP soon for care. Requesting information on female PCP. Eating well, no dizziness. No HSV outbreaks in the past year. No gyn health issues today. Not smoking at present.  No LMP recorded. Patient is postmenopausal.          Sexually active: No.  The current method of family planning is post menopausal status.    Exercising: No.  exercise Smoker:  no  ROS  Health Maintenance: Pap:  06-20-18 neg HPV HR neg, trichomonas + History of Abnormal Pap: no MMG:  2019 Self Breast exams: yes Colonoscopy:  none BMD:   58yrs ago TDaP:  unsure Shingles: none Pneumonia: none Hep C and HIV: HIV neg 2020, Hep c neg 2019, hep a + 06/2019 Labs: poct urine-neg   reports that she has been smoking. She has never used smokeless tobacco. She reports current alcohol use. She reports previous drug use.  Past Medical History:  Diagnosis Date  . 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 Hypertension   . Peripheral neuropathy 11/22/2018  . Right carpal tunnel syndrome 11/22/2018    Past Surgical History:  Procedure Laterality Date  . FINGER SURGERY     partial amputation    Current Outpatient Medications  Medication Sig Dispense Refill  . amLODipine (NORVASC) 5 MG tablet TK 1 T PO QD (Patient taking differently: Take 5 mg by mouth daily. TK 1 T PO QD) 90 tablet 1  . gabapentin (NEURONTIN) 300 MG capsule Take 1 capsule (300 mg total) by mouth 3 (three) times daily. (Patient taking differently: Take 300 mg by mouth. Takes 2 every 2 hrs) 270 capsule 1  . Multiple Vitamins-Minerals (MULTIVITAMIN PO) Take by mouth.    Marland Kitchen omeprazole  (PRILOSEC) 20 MG capsule Take 1 capsule (20 mg total) by mouth daily. 30 capsule 2  . valsartan-hydrochlorothiazide (DIOVAN-HCT) 320-25 MG tablet Take 1 tablet by mouth daily. 90 tablet 2  . VITAMIN D PO Take by mouth.    . gabapentin (NEURONTIN) 100 MG capsule Take 100 mg by mouth 5 (five) times daily.    . potassium chloride (KLOR-CON) 10 MEQ tablet Take 1 tablet (10 mEq total) by mouth daily. (Patient not taking: Reported on 07/17/2019) 10 tablet 0   No current facility-administered medications for this visit.    Family History  Problem Relation Age of Onset  . Hypertension Mother   . Heart disease Mother   . Hypertension Father   . Hypertension Sister   . Hypertension Sister     ROS:  Pertinent items are noted in HPI.  Otherwise, a comprehensive ROS was negative.  Exam:   BP 100/60   Pulse 64   Temp (!) 97 F (36.1 C) (Skin)   Resp 16   Ht 5' 6.25" (1.683 m)   Wt 173 lb (78.5 kg)   BMI 27.71 kg/m  Height: 5' 6.25" (168.3 cm) Ht Readings from Last 3 Encounters:  07/17/19 5' 6.25" (1.683 m)  07/02/19 5\' 7"  (1.702 m)  08/13/18 5\' 6"  (1.676 m)    General appearance: alert, cooperative and appears stated age Head: Normocephalic, without obvious abnormality, atraumatic Neck: no adenopathy,  supple, symmetrical, trachea midline and thyroid normal to inspection and palpation Lungs: clear to auscultation bilaterally Breasts: normal appearance, no masses or tenderness, No nipple retraction or dimpling, No nipple discharge or bleeding, No axillary or supraclavicular adenopathy Heart: regular rate and rhythm Abdomen: soft, non-tender; no masses,  no organomegaly Extremities: extremities normal, atraumatic, no cyanosis or edema Skin: Skin color, texture, turgor normal. No rashes or lesions Lymph nodes: Cervical, supraclavicular, and axillary nodes normal. No abnormal inguinal nodes palpated Neurologic: Grossly normal   Pelvic: External genitalia:  no lesions               Urethra:  normal appearing urethra with no masses, tenderness or lesions              Bartholin's and Skene's: normal                 Vagina: normal appearing vagina with normal color and discharge, no lesions              Cervix: no cervical motion tenderness, no lesions and normal appearance              Pap taken: No. Bimanual Exam:  Uterus:  normal size, contour, position, consistency, mobility, non-tender and anteverted              Adnexa: normal adnexa and no mass, fullness, tenderness               Rectovaginal: Confirms               Anus:  normal sphincter tone, no lesions  Chaperone present: yes  A:  Well Woman with normal exam  Menopausal no HRT  History of HSV 2 no further outbreaks  Mammogram and colonoscopy due  Recent hospitalization for BP issues  STD screening vaginal only  P:   Reviewed health and wellness pertinent to exam  Aware of need to advise if vaginal bleeding  Patient declines Rx Valtrex, will call if needed  Discussed importance of SBE and mammogram, patient will call to schedule at [facility].  Aware of colonoscopy need, declines scheduling or IFOB today.  Stressed importance of calling to schedule with PCP Dr. Asencion Partridge, given information to call.  Lab: Affirm, GC, Chlamydia  Pap smear: no   counseled on breast self exam, mammography screening, STD prevention, HIV risk factors and prevention, feminine hygiene, menopause, adequate intake of calcium and vitamin D, diet and exercise  return annually or prn  An After Visit Summary was printed and given to the patient.

## 2019-07-17 NOTE — Telephone Encounter (Signed)
Please advise 

## 2019-07-17 NOTE — Patient Instructions (Signed)

## 2019-07-18 ENCOUNTER — Telehealth: Payer: Self-pay | Admitting: Neurology

## 2019-07-18 ENCOUNTER — Telehealth: Payer: Self-pay | Admitting: Certified Nurse Midwife

## 2019-07-18 DIAGNOSIS — R202 Paresthesia of skin: Secondary | ICD-10-CM

## 2019-07-18 LAB — GC/CHLAMYDIA PROBE AMP
Chlamydia trachomatis, NAA: NEGATIVE
Neisseria Gonorrhoeae by PCR: NEGATIVE

## 2019-07-18 LAB — VAGINITIS/VAGINOSIS, DNA PROBE
Candida Species: NEGATIVE
Gardnerella vaginalis: POSITIVE — AB
Trichomonas vaginosis: POSITIVE — AB

## 2019-07-18 MED ORDER — METRONIDAZOLE 500 MG PO TABS: 500.0000 mg | ORAL_TABLET | Freq: Two times a day (BID) | ORAL | 0 refills | Status: DC

## 2019-07-18 NOTE — Telephone Encounter (Signed)
Spoke with Carlena Sax at [facility]. Was advised Dr. Modesta Messing first available appt 10/2019. Was advised they do have earlier appt with another provider. Carlena Sax will contact patient directly to review scheduling options.

## 2019-07-18 NOTE — Telephone Encounter (Addendum)
Called patient and informed her that Dr Anne Hahn refilled gabapentin 300 mg tbs in Nov x 6 months. She checked her bottle, confirmed and verbalized understanding, appreciation.

## 2019-07-18 NOTE — Telephone Encounter (Signed)
Spoke with patient. Advised of results as seen below per Leota Sauers, CNM. Rx for flagyl to verified pharmacy. ETOH precautions reviewed. Patient declined partner tx. OV scheduled for 1/27 at 4pm for TOC.   Reviewed PCP options with patient. RN will place call to [facility] for earlier appt, will return call. Patient agreeable.

## 2019-07-18 NOTE — Telephone Encounter (Signed)
Have no room to get her in sooner.  Sorry.  Could establish with other provider with availability if she wants sooner.

## 2019-07-18 NOTE — Telephone Encounter (Signed)
Pt is asking if she can get some of either gabapentin (NEURONTIN) 300 MG capsule or the gabapentin (NEURONTIN) 100 MG capsule until she returns to the office.  Pt confirmed that Department: [facility] called in some for her.  She is asking NP Sarah to cal her in some.

## 2019-07-18 NOTE — Telephone Encounter (Signed)
Patient want to let Mrs. Debi know she can not get an appointment with Asencion Partridge until March.

## 2019-07-18 NOTE — Telephone Encounter (Signed)
See if we can refer her due to recent health issues and hospitalization and recommendation per MD at hospital. If not with Angelina Pih with another female PCP.

## 2019-07-18 NOTE — Telephone Encounter (Signed)
Routing to Debbi, CNM for review and recommendations.

## 2019-07-18 NOTE — Telephone Encounter (Signed)
-----   Message from Verner Chol, CNM sent at 07/18/2019 11:42 AM EST ----- Notify patient her vaginal screen was negative for yeast, but positive for BV and Trichomonas again. She will need Rx Flagyl 500 mg bid x 7.  Avoid ETOH during treatment. TOC in 2-3 weeks. Partner needs to be treated. See other note she needs PCP referral due to health issues that she was hospitalized for.

## 2019-07-18 NOTE — Telephone Encounter (Signed)
Per review of Epic, patient is scheduled with Jarold Motto, PA on 08/02/19 at 8:20am.   Routing to Leota Sauers, CNM FYI.   Encounter closed.

## 2019-07-18 NOTE — Telephone Encounter (Signed)
Called pt and scheduled appt

## 2019-07-29 ENCOUNTER — Telehealth: Payer: Self-pay | Admitting: Physician Assistant

## 2019-07-29 NOTE — Telephone Encounter (Signed)
Patient called back in saying she had some more refills on the medication and was going to go by [facility] to pick it up

## 2019-07-29 NOTE — Telephone Encounter (Signed)
Called patient to reschedule her appointment for this Friday and patient started to break down crying saying she is in so much pain with her legs. She states she is out of her prescription Gabapentin, and wanted to know there would be any possible way to refill it or get her a sooner appointment than 08/22/19.

## 2019-07-30 NOTE — Telephone Encounter (Signed)
Please call pt and see if she wants to be seen sooner if so reschedule.

## 2019-07-30 NOTE — Telephone Encounter (Signed)
Scheduled for tomorrow at 1040.

## 2019-07-31 ENCOUNTER — Ambulatory Visit: Payer: Managed Care, Other (non HMO) | Admitting: Physician Assistant

## 2019-07-31 DIAGNOSIS — Z0289 Encounter for other administrative examinations: Secondary | ICD-10-CM

## 2019-08-02 ENCOUNTER — Ambulatory Visit: Payer: Managed Care, Other (non HMO) | Admitting: Physician Assistant

## 2019-08-05 NOTE — Telephone Encounter (Signed)
I called pharmacy, Rebecca Oconnor,.  Pt has : Gabapentin 300mg  TID #90 p/u 07/23/19  CW 2 refills left (3 tabs left per pt) Gabapentin 100mg  QID #180 p/u 08/01/19 NP  3 refills left (10 tabs left per pt)Gabapentn 600mg  1 bid, 1.5 qhs #75  CW 4 refills left. (6 tabs left per pt)   I spoke to pt and she is taking 600mg  po every 2-3 hours.  Duloxetine did not work.  What do you want to do.  Has appt 08-20-19, 08-11-18 has pcp visit w/ labs.  Her legs keep her up at night.

## 2019-08-05 NOTE — Telephone Encounter (Signed)
Pharmacy is calling in wanting to discuss the way the patient is consuming her Gabapentin and how many scripts that she has for it, they also need to know if it is ok to refill

## 2019-08-06 MED ORDER — GABAPENTIN 600 MG PO TABS: 600.0000 mg | ORAL_TABLET | Freq: Four times a day (QID) | ORAL | 0 refills | Status: DC

## 2019-08-06 NOTE — Addendum Note (Signed)
Addended by: Glean Salvo on: 08/06/2019 04:25 PM   Modules accepted: Orders

## 2019-08-06 NOTE — Telephone Encounter (Signed)
I called Rebecca Oconnor at the pharmacy and told her that all other prescriptions for gabapentin need to be cancelled and use only the one for gabapentin 600gm po QID from Margie Ege, NP .  She stated she understood.  I relayed this to pt.  I made her a OV appt for 08-12-19 at 1545.  She verbalized understanding.  Will come Thursday and get labs as has an appt on Wednesday with OG/GYN.

## 2019-08-06 NOTE — Telephone Encounter (Signed)
Patient called requesting a refill for her gabapentin.  Patient states she is out and would like to get it today if possible.

## 2019-08-06 NOTE — Telephone Encounter (Signed)
I called the patient.  There has been a lot of confusion surrounding her dose of gabapentin, when she was discharged from the hospital she was taking 300 mg 3 times a day, when I talked to her earlier this month she was taking 600 mg 4 times a day.  She seems to have extra gabapentin prescription for different strengths.  She is reporting pain in her legs, cramps, where she cannot walk, gabapentin seems to really help.  She is exceeding the maximum dose of gabapentin.  I will send in gabapentin 600 mg tablet to take 4 times a day. Please schedule her revisit to see me sooner (Dr. Anne Hahn has no openings), Monday would work 2/1 for an office evaluation. She will start Cymbalta 30 mg at bedtime (she has the pills at home). She needs lab work done, by her PCP, which she is also seeing on Monday. She will come by the office tomorrow to have lab work done, CBC, CMP, I am interested in her electrolytes and kidney function.

## 2019-08-07 ENCOUNTER — Encounter: Payer: Self-pay | Admitting: Certified Nurse Midwife

## 2019-08-07 ENCOUNTER — Ambulatory Visit: Payer: No Typology Code available for payment source | Admitting: Certified Nurse Midwife

## 2019-08-07 ENCOUNTER — Other Ambulatory Visit: Payer: Self-pay

## 2019-08-07 VITALS — BP 114/72 | HR 68 | Temp 98.1°F | Resp 16 | Wt 173.0 lb

## 2019-08-07 DIAGNOSIS — N760 Acute vaginitis: Secondary | ICD-10-CM | POA: Diagnosis not present

## 2019-08-07 DIAGNOSIS — Z113 Encounter for screening for infections with a predominantly sexual mode of transmission: Secondary | ICD-10-CM

## 2019-08-07 DIAGNOSIS — B9689 Other specified bacterial agents as the cause of diseases classified elsewhere: Secondary | ICD-10-CM | POA: Diagnosis not present

## 2019-08-07 DIAGNOSIS — A599 Trichomoniasis, unspecified: Secondary | ICD-10-CM | POA: Diagnosis not present

## 2019-08-07 NOTE — Progress Notes (Signed)
Review of Systems  Constitutional: Negative.   HENT: Negative.   Eyes: Negative.   Respiratory: Negative.   Cardiovascular: Negative.   Gastrointestinal: Negative.   Genitourinary: Negative.        No vaginal discharge or odor  Musculoskeletal: Negative.   Skin: Negative.   Neurological: Negative.   Endo/Heme/Allergies: Negative.   Psychiatric/Behavioral: Negative.     54 y.o. Single African American female G0P0000 here for follow up of Trichomonas and BV treated with Flagyl 500 mg  initiated on January 01/2020. Patient completed all medication as directed. Denies any symptoms of vaginal discharge or vaginal odor. Has not been sexually active since she was treated. Using condoms now consistently. Denies vaginal bleeding. No other health issues today.    O: Healthy WD,WN female Affect: normal, orientation x 3 Skin:warm and dry Abdomen:soft, non tender, no masses Pelvic exam:EXTERNAL GENITALIA: normal appearing vulva with no masses, tenderness or lesions VAGINA: normal appearance, no abnormal discharge noted or tenderness CERVIX: no lesions or cervical motion tenderness and normal appearance UTERUS: normal shape, soft, non tender ADNEXA: no masses palpable and nontender RECTUM: normal appearance no lesions  A:History of Trichomonas/Bacterial Vaginitis infection treated. Normal pelvic exam, no indication of infection   P: Discussed findings of normal pelvic exam and normal discharge appearance. Discussed consistent condom use for prevention of STD. Questions addressed at length. Lab: affirm will await results   10 minutes in time with face to face discussion of STD infection   Rv prn

## 2019-08-07 NOTE — Patient Instructions (Signed)
Preventing Sexually Transmitted Infections, Adult Sexually transmitted infections (STIs) are diseases that are passed (transmitted) from person to person through bodily fluids exchanged during sex or sexual contact. Bodily fluids include saliva, semen, blood, vaginal mucus, and urine. You may have an increased risk for developing an STI if you have unprotected oral, vaginal, or anal sex. Some common STIs include:  Herpes.  Hepatitis B.  Chlamydia.  Gonorrhea.  Syphilis.  HPV (human papillomavirus).  HIV (human immunodeficiency virus), the virus that can cause AIDS (acquired immunodeficiency syndrome). How can I protect myself from sexually transmitted infections? The only way to completely prevent STIs is not to have sex of any kind (practice abstinence). This includes oral, vaginal, or anal sex. If you are sexually active, take these actions to lower your risk of getting an STI:  Have only one sex partner (be monogamous) or limit the number of sexual partners you have.  Stay up-to-date on immunizations. Certain vaccines can lower your risk of getting certain STIs, such as: ? Hepatitis A and B vaccines. You may have been vaccinated as a young child, but likely need a booster shot as a teen or young adult. ? HPV vaccine.  Use methods that prevent the exchange of body fluids between partners (barrier protection) every time you have sex. Barrier protection can be used during oral, vaginal, or anal sex. Commonly used barrier methods include: ? Female condom. ? Female condom. ? Dental dam.  Get tested regularly for STIs. Have your sexual partner get tested regularly as well.  Avoid mixing alcohol, drugs, and sex. Alcohol and drug use can affect your ability to make good decisions and can lead to risky sexual behaviors.  Ask your health care provider about taking pre-exposure prophylaxis (PrEP) to prevent HIV infection if you: ? Have a HIV-positive sexual partner. ? Have multiple sexual  partners or partners who do not know their HIV status, and do not regularly use a condom during sex. ? Use injection drugs and share needles. Birth control pills, injections, implants, and intrauterine devices (IUDs) do not protect against STIs. To prevent both STIs and pregnancy, always use a condom with another form of birth control. Some STIs, such as herpes, are spread through skin to skin contact. A condom does not protect you from getting such STIs. If you or your partner have herpes and there is an active flare with open sores, avoid all sexual contact. Why are these changes important? Taking steps to practice safe sex protects you and others. Many STIs can be cured. However, some STIs are not curable and will affect you for the rest of your life. STIs can be passed on to another person even if you do not have symptoms. What can happen if changes are not made? Certain STIs may:  Require you to take medicine for the rest of your life.  Affect your ability to have children (your fertility).  Increase your risk for developing another STI or certain serious health conditions, such as: ? Cervical cancer. ? Head and neck cancer. ? Pelvic inflammatory disease (PID) in women. ? Organ damage or damage to other parts of your body, if the infection spreads.  Be passed to a baby during childbirth. How are sexually transmitted infections treated? If you or your partner know or think that you may have an STI:  Talk with your health care provider about what can be done to treat it. Some STIs can be treated and cured with medicines.  For curable STIs, you and   your partner should avoid sex during treatment and for several days after treatment is complete.  You and your partner should both be treated at the same time, if there is any chance that your partner is infected as well. If you get treatment but your partner does not, your partner can re-infect you when you resume sexual contact.  Do not  have unprotected sex. Where to find more information Learn more about sexually transmitted diseases and infections from:  Centers for Disease Control and Prevention: ? More information about specific STIs: www.cdc.gov/std ? Find places to get sexual health counseling and treatment for free or for a low cost: gettested.cdc.gov  U.S. Department of Health and Human Services: www.womenshealth.gov/publications/our-publications/fact-sheet/sexually-transmitted-infections.html Summary  The only way to completely prevent STIs is not to have sex (practice abstinence), including oral, vaginal, or anal sex.  STIs can spread through saliva, semen, blood, vaginal mucus, urine, or sexual contact.  If you do have sex, limit your number of sexual partners and use a barrier protection method every time you have sex.  If you develop an STI, get treated right away and ask your partner to be treated as well. Do not resume having sex until both of you have completed treatment for the STI. This information is not intended to replace advice given to you by your health care provider. Make sure you discuss any questions you have with your health care provider. Document Revised: 11/20/2018 Document Reviewed: 06/23/2016 Elsevier Patient Education  2020 Elsevier Inc.  

## 2019-08-08 LAB — VAGINITIS/VAGINOSIS, DNA PROBE
Candida Species: NEGATIVE
Gardnerella vaginalis: POSITIVE — AB
Trichomonas vaginosis: NEGATIVE

## 2019-08-09 ENCOUNTER — Telehealth: Payer: Self-pay

## 2019-08-09 ENCOUNTER — Encounter: Payer: Self-pay | Admitting: Physician Assistant

## 2019-08-09 ENCOUNTER — Other Ambulatory Visit: Payer: Self-pay

## 2019-08-09 DIAGNOSIS — B9689 Other specified bacterial agents as the cause of diseases classified elsewhere: Secondary | ICD-10-CM

## 2019-08-09 MED ORDER — CLINDAMYCIN PHOSPHATE 2 % VA CREA: 1.0000 | TOPICAL_CREAM | Freq: Every day | VAGINAL | 0 refills | Status: AC

## 2019-08-09 NOTE — Telephone Encounter (Signed)

## 2019-08-09 NOTE — Progress Notes (Signed)
Rx Cleocin sent per D. Lenoard, CNM orders from lab results on 08/08/2019.

## 2019-08-11 NOTE — Progress Notes (Signed)
PATIENT: Rebecca Oconnor DOB: 1966-01-07  REASON FOR VISIT: follow up HISTORY FROM: patient  HISTORY OF PRESENT ILLNESS: Today 08/12/19  Rebecca Oconnor 54 year old female with history of sensory alterations that began in the feet and gradually worked up to include the entire legs and up to the body to the mid chest level.  She was hospitalized 07/04/2019 for hyponatremia, elevated creatinine.  Previously she had a EMG nerve conduction evaluation that suggested a mild peripheral neuropathy, evidence of right carpal tunnel syndrome, a denervation pattern that could be consistent with a right S1 radiculopathy.  Blood work has revealed an elevated angiotensin-converting enzyme, but she is a smoker.  She has recently complained of sensation of her legs tightening up, cramping.  This sensation has greatly helped by gabapentin, to the point she was taking too much medication.  She saw her PCP today, had lab work done.  She says in November and December, she was eating poorly, drinking excess red wine.  She has since cleaned up her diet, is no longer drinking red wine.  She is overall feeling better, but has not changed her sensation in her legs.  She denies sensory alteration from her knees up, but reports numbness from her knees down, sensation of sharp pains, cramping.  She feels her legs are tight.  She has some swelling in her ankles.  When she takes gabapentin, her pain goes away.  She denies back pain.  She denies any changes to her vision, or bowels or bladder.  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  She tried Cymbalta, did not like the way it made her feel.  She presents today for evaluation unaccompanied.  HISTORY 07/16/2019 SS: Rebecca Oconnor is a 54 year old female with history of sensory alterations that began in the feet and gradually worked up to include the entire legs and up the body to the mid chest level.  She remains on gabapentin.  She was hospitalized, and discharged 07/04/2019 for  hyponatremia, and elevated creatinine.  Previously, she has had EMG nerve conduction evaluation that suggested a mild peripheral neuropathy, evidence of right carpal tunnel syndrome, a denervation pattern that could be consistent with a right S1 radiculopathy.  Blood work has revealed an elevated angiotensin-converting enzyme, but she is a smoker.  She reports since Sunday, she describes a sensation of her legs tightening up, cramping.  She says she will be fine for 15 minutes then will develop a tightening sensation.  She also has swelling in her ankles.  She continues to complain of burning, stinging, and weakness in her legs.  She no longer complains of numbness.  She says the sensation goes up the back of her legs. She does not think it goes to her back or chest.  She was discharged home from the hospital with potassium, she has completed this.  She says she has been drinking plenty of water, and that her urine is clear.  She has been taking extra gabapentin, 600 mg 4 times a day.  She denies any urinary or bowel incontinence.  She continues to work from home, is elevating her legs.  She reports gait stability, does not have to use assistive device, but is walking for longer than 15 minutes develops sensory alteration.  She presents today for evaluation via telephone visit.  11/22/2018 Dr. Anne Hahn: Rebecca Oconnor is a 54 year old right-handed black female with a history of sensory alterations that began in the feet and worked up gradually to include the entire legs  and up on the body to the mid chest level. The patient was evaluated for a possible transverse myelitis, MRI of the cervical and thoracic spine did not show any definite cervical spine abnormalities, there was a small residual central canal seen, no transverse myelitis. The patient had EMG nerve conduction study that showed evidence of a peripheral neuropathy but she also had right carpal tunnel syndrome. The patient has been treated with gabapentin  which seemed to help some, she was given a prescription for Cymbalta but never started the medication. The patient has had improvement in the sensory complaints, she now has numbness mainly in the legs below the knees, she has bilateral hand numbness, right greater than left that may be associated with the carpal tunnel syndrome. She does have some mild gait instability, she has not had any falls, she denies issues controlling the bowels or the bladder. The blood work showed an elevated angiotensin-converting enzyme level, but she is a smoker. We had discussed previously getting MRI of the brain and potentially getting a lumbar puncture, but the patient did not wish to pursue further evaluation at that time. Currently, she is working from home, she does fairly well with the discomfort as long she takes the gabapentin, 100 mg capsules every 2 hours while awake and then she takes a 300 mg capsule at night.   REVIEW OF SYSTEMS: Out of a complete 14 system review of symptoms, the patient complains only of the following symptoms, and all other reviewed systems are negative.  Cramping, numbness  ALLERGIES: No Known Allergies  HOME MEDICATIONS: Outpatient Medications Prior to Visit  Medication Sig Dispense Refill  . amLODipine (NORVASC) 5 MG tablet TK 1 T PO QD 90 tablet 1  . cholecalciferol (VITAMIN D3) 25 MCG (1000 UNIT) tablet Take 1,000 Units by mouth daily.    . clindamycin (CLEOCIN) 2 % vaginal cream Place 1 Applicatorful vaginally at bedtime for 7 days. 40 g 0  . gabapentin (NEURONTIN) 600 MG tablet Take 1 tablet (600 mg total) by mouth 4 (four) times daily. 60 tablet 0  . omeprazole (PRILOSEC) 20 MG capsule Take 1 capsule (20 mg total) by mouth daily. 30 capsule 2  . valsartan-hydrochlorothiazide (DIOVAN-HCT) 320-25 MG tablet Take 1 tablet by mouth daily. 90 tablet 2   No facility-administered medications prior to visit.    PAST MEDICAL HISTORY: Past Medical History:  Diagnosis Date  .  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 Hypertension   . Peripheral neuropathy 11/22/2018  . Right carpal tunnel syndrome 11/22/2018  . STD (sexually transmitted disease)    trichomonas treated 06/2019 & 07/2019    PAST SURGICAL HISTORY: Past Surgical History:  Procedure Laterality Date  . FINGER SURGERY     partial amputation    FAMILY HISTORY: Family History  Problem Relation Age of Onset  . Hypertension Mother   . Heart disease Mother   . Hypertension Father   . Hypertension Sister   . Hypertension Sister     SOCIAL HISTORY: Social History   Socioeconomic History  . Marital status: Single    Spouse name: Not on file  . Number of children: 0  . Years of education: Not on file  . Highest education level: Some college, no degree  Occupational History    Comment: [employer]  Tobacco Use  . Smoking status: Current Every Day Smoker  . Smokeless tobacco: Never Used  . Tobacco comment: 3  a day  Substance and Sexual Activity  . Alcohol use: Yes    Comment: occ  . Drug use: Not Currently  . Sexual activity: Not Currently    Partners: Male    Birth control/protection: Abstinence, Post-menopausal  Other Topics Concern  . Not on file  Social History Narrative  . Not on file   Social Determinants of Health   Financial Resource Strain:   . Difficulty of Paying Living Expenses: Not on file  Food Insecurity:   . Worried About Charity fundraiser in the Last Year: Not on file  . Ran Out of Food in the Last Year: Not on file  Transportation Needs:   . Lack of Transportation (Medical): Not on file  . Lack of Transportation (Non-Medical): Not on file  Physical Activity:   . Days of Exercise per Week: Not on file  . Minutes of Exercise per Session: Not on file  Stress:   . Feeling of Stress : Not on file  Social Connections:   . Frequency of Communication with Friends and Family: Not on file  . Frequency of Social  Gatherings with Friends and Family: Not on file  . Attends Religious Services: Not on file  . Active Member of Clubs or Organizations: Not on file  . Attends Archivist Meetings: Not on file  . Marital Status: Not on file  Intimate Partner Violence:   . Fear of Current or Ex-Partner: Not on file  . Emotionally Abused: Not on file  . Physically Abused: Not on file  . Sexually Abused: Not on file   PHYSICAL EXAM  Vitals:   08/12/19 1557  BP: (!) 139/94  Pulse: (!) 101  Temp: 97.7 F (36.5 C)  Weight: 174 lb 6.4 oz (79.1 kg)  Height: 5' 6.5" (1.689 m)   Body mass index is 27.73 kg/m.  Generalized: Well developed, in no acute distress   Neurological examination  Mentation: Alert oriented to time, place, history taking. Follows all commands speech and language fluent Cranial nerve II-XII: Pupils were equal round reactive to light. Extraocular movements were full, visual field were full on confrontational test. Facial sensation and strength were normal.  Head turning and shoulder shrug  were normal and symmetric. Motor: The motor testing reveals 5 over 5 strength of all 4 extremities. Good symmetric motor tone is noted throughout.  Sensory: Stocking pattern sensory deficit to pinprick to mid shin Coordination: Cerebellar testing reveals good finger-nose-finger and heel-to-shin bilaterally.  Gait and station: Gait is normal. Tandem gait is normal. Romberg is negative. No drift is seen.  Reflexes: Deep tendon reflexes are symmetric, right patellar is 1+, left is 2+  DIAGNOSTIC DATA (LABS, IMAGING, TESTING) - I reviewed patient records, labs, notes, testing and imaging myself where available.  Lab Results  Component Value Date   WBC 7.0 07/04/2019   HGB 8.9 (L) 07/04/2019   HCT 25.6 (L) 07/04/2019   MCV 91.8 07/04/2019   PLT 287 07/04/2019      Component Value Date/Time   NA 134 (L) 07/04/2019 0341   NA 139 08/08/2018 1553   K 3.2 (L) 07/04/2019 0341   CL 101  07/04/2019 0341   CO2 17 (L) 07/04/2019 0341   GLUCOSE 116 (H) 07/04/2019 0341   BUN 17 07/04/2019 0341   BUN 10 08/08/2018 1553   CREATININE 2.85 (H) 07/04/2019 0341   CALCIUM 8.8 (L) 07/04/2019 0341   PROT 5.6 (L) 07/04/2019 0341   PROT 7.0 08/08/2018 1553  ALBUMIN 3.1 (L) 07/04/2019 0341   ALBUMIN 4.3 08/08/2018 1553   AST 141 (H) 07/04/2019 0341   ALT 81 (H) 07/04/2019 0341   ALKPHOS 59 07/04/2019 0341   BILITOT 0.2 (L) 07/04/2019 0341   BILITOT 0.4 08/08/2018 1553   GFRNONAA 18 (L) 07/04/2019 0341   GFRAA 21 (L) 07/04/2019 0341   No results found for: CHOL, HDL, LDLCALC, LDLDIRECT, TRIG, CHOLHDL Lab Results  Component Value Date   HGBA1C 5.1 08/08/2018   Lab Results  Component Value Date   VITAMINB12 945 08/08/2018   Lab Results  Component Value Date   TSH 3.268 07/03/2019      ASSESSMENT AND PLAN 54 y.o. year old female  has a past medical history of HSV-2 (herpes simplex virus 2) infection, Hypertension, Peripheral neuropathy (11/22/2018), Right carpal tunnel syndrome (11/22/2018), and STD (sexually transmitted disease). here with:  1.  Numbness 2.  Paresthesia bilateral lower extremities  She had routine lab work checked by her primary doctor today, following hospitalization in December, when last checked creatinine was 2.5, AST was 140 ALT 81.  She continues to complain of numbness, sharp pain, cramping to the bilateral lower extremities, that is well improved with gabapentin.  She will remain on gabapentin 600 mg 4 times a day as needed.  I will review lab work once it results.  I will order MRI of the brain with and without contrast if kidney function allows (she has a special place to get MRI of the brain with her company insurance, she will let me know where to send the order tomorrow).  She does have swelling to her lower extremities, could be coming from taking too much gabapentin?  It is possible she may have sarcoidosis, if MRI of the brain is normal, would  consider lumbar puncture. 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 She will follow-up in 3 months with Dr. Anne Hahn or sooner if needed.  I spent 25 minutes with the patient. 50% of this time was spent discussing her plan of care.   Margie Ege, AGNP-C, DNP 08/12/2019, 4:01 PM [facility] [address], [address] [place], [place] [address] [phone]

## 2019-08-12 ENCOUNTER — Ambulatory Visit (INDEPENDENT_AMBULATORY_CARE_PROVIDER_SITE_OTHER): Payer: No Typology Code available for payment source | Admitting: Neurology

## 2019-08-12 ENCOUNTER — Ambulatory Visit (INDEPENDENT_AMBULATORY_CARE_PROVIDER_SITE_OTHER): Payer: No Typology Code available for payment source | Admitting: Internal Medicine

## 2019-08-12 ENCOUNTER — Encounter: Payer: Self-pay | Admitting: Internal Medicine

## 2019-08-12 ENCOUNTER — Other Ambulatory Visit: Payer: Self-pay

## 2019-08-12 ENCOUNTER — Encounter: Payer: Self-pay | Admitting: Neurology

## 2019-08-12 VITALS — BP 140/102 | HR 101 | Temp 97.3°F | Resp 17 | Ht 66.0 in | Wt 176.0 lb

## 2019-08-12 VITALS — BP 139/94 | HR 101 | Temp 97.7°F | Ht 66.5 in | Wt 174.4 lb

## 2019-08-12 DIAGNOSIS — Z1231 Encounter for screening mammogram for malignant neoplasm of breast: Secondary | ICD-10-CM | POA: Diagnosis not present

## 2019-08-12 DIAGNOSIS — R202 Paresthesia of skin: Secondary | ICD-10-CM

## 2019-08-12 DIAGNOSIS — E878 Other disorders of electrolyte and fluid balance, not elsewhere classified: Secondary | ICD-10-CM

## 2019-08-12 DIAGNOSIS — D649 Anemia, unspecified: Secondary | ICD-10-CM | POA: Diagnosis not present

## 2019-08-12 DIAGNOSIS — Z1211 Encounter for screening for malignant neoplasm of colon: Secondary | ICD-10-CM | POA: Diagnosis not present

## 2019-08-12 DIAGNOSIS — G603 Idiopathic progressive neuropathy: Secondary | ICD-10-CM

## 2019-08-12 DIAGNOSIS — Z0000 Encounter for general adult medical examination without abnormal findings: Secondary | ICD-10-CM

## 2019-08-12 DIAGNOSIS — I10 Essential (primary) hypertension: Secondary | ICD-10-CM

## 2019-08-12 DIAGNOSIS — N179 Acute kidney failure, unspecified: Secondary | ICD-10-CM

## 2019-08-12 DIAGNOSIS — R7989 Other specified abnormal findings of blood chemistry: Secondary | ICD-10-CM

## 2019-08-12 DIAGNOSIS — Z13228 Encounter for screening for other metabolic disorders: Secondary | ICD-10-CM

## 2019-08-12 NOTE — Patient Instructions (Addendum)
Restart the Amlodipine daily.    Keeping You Healthy  Get These Tests  Blood Pressure- Have your blood pressure checked by your healthcare provider at least once a year.  Normal blood pressure is 120/80.  Weight- Have your body mass index (BMI) calculated to screen for obesity.  BMI is a measure of body fat based on height and weight.  You can calculate your own BMI at https://www.west-esparza.com/  Cholesterol- Have your cholesterol checked every year.  Diabetes- Have your blood sugar checked every year if you have high blood pressure, high cholesterol, a family history of diabetes or if you are overweight.  Pap Test - Have a pap test every 1 to 5 years if you have been sexually active.  If you are older than 65 and recent pap tests have been normal you may not need additional pap tests.  In addition, if you have had a hysterectomy  for benign disease additional pap tests are not necessary.  Mammogram-Yearly mammograms are essential for early detection of breast cancer  Screening for Colon Cancer- Colonoscopy starting at age 61. Screening may begin sooner depending on your family history and other health conditions.  Follow up colonoscopy as directed by your Gastroenterologist.  Screening for Osteoporosis- Screening begins at age 41 with bone density scanning, sooner if you are at higher risk for developing Osteoporosis.  Get these medicines  Calcium with Vitamin D- Your body requires 1200-1500 mg of Calcium a day and [phone] IU of Vitamin D a day.  You can only absorb 500 mg of Calcium at a time therefore Calcium must be taken in 2 or 3 separate doses throughout the day.  Hormones- Hormone therapy has been associated with increased risk for certain cancers and heart disease.  Talk to your healthcare provider about if you need relief from menopausal symptoms.  Aspirin- Ask your healthcare provider about taking Aspirin to prevent Heart Disease and Stroke.  Get these Immuniztions  Flu  shot- Every fall  Pneumonia shot- Once after the age of 72; if you are younger ask your healthcare provider if you need a pneumonia shot.  Tetanus- Every ten years.  Zostavax- Once after the age of 61 to prevent shingles.  Take these steps  Don't smoke- Your healthcare provider can help you quit. For tips on how to quit, ask your healthcare provider or go to www.smokefree.gov or call 1-800 QUIT-NOW.  Be physically active- Exercise 5 days a week for a minimum of 30 minutes.  If you are not already physically active, start slow and gradually work up to 30 minutes of moderate physical activity.  Try walking, dancing, bike riding, swimming, etc.  Eat a healthy diet- Eat a variety of healthy foods such as fruits, vegetables, whole grains, low fat milk, low fat cheeses, yogurt, lean meats, chicken, fish, eggs, dried beans, tofu, etc.  For more information go to www.thenutritionsource.org  Dental visit- Brush and floss teeth twice daily; visit your dentist twice a year.  Eye exam- Visit your Optometrist or Ophthalmologist yearly.  Drink alcohol in moderation- Limit alcohol intake to one drink or less a day.  Never drink and drive.  Depression- Your emotional health is as important as your physical health.  If you're feeling down or losing interest in things you normally enjoy, please talk to your healthcare provider.  Seat Belts- can save your life; always wear one  Smoke/Carbon Monoxide detectors- These detectors need to be installed on the appropriate level of your home.  Replace batteries at  least once a year.  Violence- If anyone is threatening or hurting you, please tell your healthcare provider.  Living Will/ Health care power of attorney- Discuss with your healthcare provider and family.

## 2019-08-12 NOTE — Progress Notes (Signed)
Subjective:    Rebecca Oconnor - 54 y.o. female MRN [id number]  Date of birth: 07-07-1966  HPI  Rebecca Oconnor is here for annual exam. 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 Additionally, had AKI and electrolyte derangements. Was instructed to wait a few days to take Vaslartan-HCTZ after discharge and then to take every other day. Has been taking K supplements 99 mg OTC 4 tablets at once per day. Has been taking Amlodipine 10 mg prn. Diarrhea has resolved since hospitalization. She is drinking wine throughout the day while she is working at home. Drinking 4-5 glasses of wine per day. Does "flush" with water; 4-5 bottles per day.   Chronic HTN Disease Monitoring: Monitors at home. 136/106. 123/98. 143/105.  Home BP Monitoring - Monitors at home  Chest pain- no  Dyspnea- no Headache - no  Medications: Valsartan-HCTZ every other day, Amlodipine prn  Compliance- yes Lightheadedness- no  Edema- no    Health Maintenance Due  Topic Date Due  . MAMMOGRAM  11/26/2017  . COLONOSCOPY  11/26/2017    -  reports that she has been smoking. She has never used smokeless tobacco. - Review of Systems: Per HPI. - Past Medical History: Patient Active Problem List   Diagnosis Date Noted  . Paresthesia 07/16/2019  . AKI (acute kidney injury) (HCC) 07/03/2019  . Hypomagnesemia 07/03/2019  . Hypophosphatemia 07/03/2019  . Hyponatremia 07/02/2019  . Peripheral neuropathy 11/22/2018  . Right carpal tunnel syndrome 11/22/2018   - Medications: reviewed and updated   Objective:   Physical Exam BP (!) 140/102   Pulse (!) 101   Temp (!) 97.3 F (36.3 C) (Temporal)   Resp 17   Ht 5\' 6"  (1.676 m)   Wt 176 lb (79.8 kg)   SpO2 95%   BMI 28.41 kg/m  Physical Exam  Constitutional: She is oriented to person, place, and time and well-developed, well-nourished, and in no distress.  HENT:  Head: Normocephalic and atraumatic.   Mouth/Throat: Oropharynx is clear and moist.  Eyes: Pupils are equal, round, and reactive to light. Conjunctivae and EOM are normal.  Neck: No thyromegaly present.  Cardiovascular: Normal rate, regular rhythm, normal heart sounds and intact distal pulses.  No murmur heard. Pulmonary/Chest: Effort normal and breath sounds normal. No respiratory distress. She has no wheezes.  Abdominal: Soft. Bowel sounds are normal. She exhibits no distension. There is no abdominal tenderness. There is no rebound and no guarding.  Musculoskeletal:        General: No deformity or edema. Normal range of motion.     Cervical back: Normal range of motion and neck supple.  Lymphadenopathy:    She has no cervical adenopathy.  Neurological: She is alert and oriented to person, place, and time. Gait normal.  Skin: Skin is warm and dry. No rash noted. She is not diaphoretic.  Psychiatric: Mood, affect and judgment normal.        Assessment & Plan:   1. Annual physical exam Counseled on 150 minutes of exercise per week, healthy eating (including decreased daily intake of saturated fats, cholesterol, added sugars, sodium), STI prevention, routine healthcare maintenance.  2. Colon cancer screening - Ambulatory referral to Gastroenterology  3. Encounter for screening mammogram for malignant neoplasm of breast - MM Digital Screening; Future  4. Normocytic anemia Noted to have low HgB during hospitalization. Denies blood in stool. Will plan for colonoscopy given age.  - CBC -  Iron, TIBC and Ferritin Panel  5. Elevated LFTs On day of discharge (12/24) AST 141 (from 164>193 at admission) and ALT 81 (from 103 at admission). Patient was found to have Hep A and also counseled to d/c daily alcohol intake. Again, reiterated importance of limiting alcohol intake as she has not made any lifestyle changes.  - Comprehensive metabolic panel  6. Electrolyte abnormality On date of discharge (12/24) K 3.2, Na 134, M 2.1.   - Comprehensive metabolic panel - Magnesium  7. AKI (acute kidney injury) ([place]) Cr 2.85 at discharge from 5.19 at admission.  - Comprehensive metabolic panel  8. Essential hypertension BP is not at goal today. Start taking Amlodipine 10 mg daily again. Continue to monitor at home. Will advise regarding Losartan-HCTZ use when lab work results available.  Counseled on blood pressure goal of less than 130/80, low-sodium, DASH diet, medication compliance, 150 minutes of moderate intensity exercise per week. Discussed medication compliance, adverse effects.  9. Screening for metabolic disorder - Lipid panel   Phill Myron, D.O. 08/12/2019, 2:23 PM Primary Care at [facility]

## 2019-08-12 NOTE — Patient Instructions (Addendum)
You may continue the gabapentin, I will review lab results tomorrow when they result   Let's order MRI of the brain, please let me know where to send the order   See you back in 3-4 months with Dr. Anne Hahn

## 2019-08-12 NOTE — Progress Notes (Signed)
I have read the note, and I agree with the clinical assessment and plan.  Ngan Qualls K Jolicia Delira   

## 2019-08-13 ENCOUNTER — Other Ambulatory Visit: Payer: Self-pay | Admitting: Internal Medicine

## 2019-08-13 ENCOUNTER — Encounter: Payer: Self-pay | Admitting: Neurology

## 2019-08-13 ENCOUNTER — Telehealth: Payer: Self-pay

## 2019-08-13 DIAGNOSIS — E785 Hyperlipidemia, unspecified: Secondary | ICD-10-CM

## 2019-08-13 LAB — CBC
Hematocrit: 34.4 % (ref 34.0–46.6)
Hemoglobin: 11.8 g/dL (ref 11.1–15.9)
MCH: 31.6 pg (ref 26.6–33.0)
MCHC: 34.3 g/dL (ref 31.5–35.7)
MCV: 92 fL (ref 79–97)
Platelets: 455 10*3/uL — ABNORMAL HIGH (ref 150–450)
RBC: 3.73 x10E6/uL — ABNORMAL LOW (ref 3.77–5.28)
RDW: 14.7 % (ref 11.7–15.4)
WBC: 8.4 10*3/uL (ref 3.4–10.8)

## 2019-08-13 LAB — COMPREHENSIVE METABOLIC PANEL
ALT: 11 IU/L (ref 0–32)
AST: 22 IU/L (ref 0–40)
Albumin/Globulin Ratio: 1.5 (ref 1.2–2.2)
Albumin: 4.3 g/dL (ref 3.8–4.9)
Alkaline Phosphatase: 65 IU/L (ref 39–117)
BUN/Creatinine Ratio: 13 (ref 9–23)
BUN: 11 mg/dL (ref 6–24)
Bilirubin Total: 0.4 mg/dL (ref 0.0–1.2)
CO2: 23 mmol/L (ref 20–29)
Calcium: 10.4 mg/dL — ABNORMAL HIGH (ref 8.7–10.2)
Chloride: 105 mmol/L (ref 96–106)
Creatinine, Ser: 0.85 mg/dL (ref 0.57–1.00)
GFR calc Af Amer: 92 mL/min/{1.73_m2} (ref >59)
GFR calc non Af Amer: 80 mL/min/{1.73_m2} (ref >59)
Globulin, Total: 2.9 g/dL (ref 1.5–4.5)
Glucose: 79 mg/dL (ref 65–99)
Potassium: 4.9 mmol/L (ref 3.5–5.2)
Sodium: 145 mmol/L — ABNORMAL HIGH (ref 134–144)
Total Protein: 7.2 g/dL (ref 6.0–8.5)

## 2019-08-13 LAB — LIPID PANEL
Chol/HDL Ratio: 3.8 ratio (ref 0.0–4.4)
Cholesterol, Total: 272 mg/dL — ABNORMAL HIGH (ref 100–199)
HDL: 72 mg/dL (ref >39)
LDL Chol Calc (NIH): 145 mg/dL — ABNORMAL HIGH (ref 0–99)
Triglycerides: 304 mg/dL — ABNORMAL HIGH (ref 0–149)
VLDL Cholesterol Cal: 55 mg/dL — ABNORMAL HIGH (ref 5–40)

## 2019-08-13 LAB — IRON,TIBC AND FERRITIN PANEL
Ferritin: 436 ng/mL — ABNORMAL HIGH (ref 15–150)
Iron Saturation: 32 % (ref 15–55)
Iron: 94 ug/dL (ref 27–159)
Total Iron Binding Capacity: 293 ug/dL (ref 250–450)
UIBC: 199 ug/dL (ref 131–425)

## 2019-08-13 LAB — MAGNESIUM: Magnesium: 1.8 mg/dL (ref 1.6–2.3)

## 2019-08-13 MED ORDER — ROSUVASTATIN CALCIUM 10 MG PO TABS: 10.0000 mg | ORAL_TABLET | Freq: Every day | ORAL | 3 refills | Status: DC

## 2019-08-13 NOTE — Progress Notes (Signed)
Patient notified of results & recommendations. Expressed understanding.  She is agreeable to starting cholesterol medication. Would like it sent to the pharmacy on file.

## 2019-08-13 NOTE — Progress Notes (Signed)
Patient notified

## 2019-08-13 NOTE — Progress Notes (Signed)
I have sent a moderate dose statin, Crestor 10 mg, to her pharmacy.   Marcy Siren, D.O. Primary Care at [facility]  08/13/2019, 4:01 PM

## 2019-08-13 NOTE — Telephone Encounter (Signed)
Patient has requested a CB in regards to her mychart message. Please follow up

## 2019-08-13 NOTE — Progress Notes (Signed)
Please call Rebecca Oconnor about her lab results. Hemoglobin is stable and shows no signs of anemia.  Her Na is one point above normal, this is typically not something to worry about and if she increases fluid slightly more that should help. Her kidneys, other electrolytes, liver function have all normalized.   Cholesterol and LDL are elevated. Her 10 year risk of heart attack or stroke is 9.3%. She is in the area where cholesterol lowering medication may have some benefit to lower her risk. I would suggest given her numbers that she take one daily. If she is interested I can prescribe this to her.   The 10-year ASCVD risk score Denman George DC Montez Hageman., et al., 2013) is: 9.3%   Values used to calculate the score:     Age: 54 years     Sex: Female     Is Non-Hispanic African American: Yes     Diabetic: No     Tobacco smoker: Yes     Systolic Blood Pressure: 139 mmHg     Is BP treated: Yes     HDL Cholesterol: 72 mg/dL     Total Cholesterol: 272 mg/dL   Thanks,  Marcy Siren, D.O. Primary Care at [facility]  08/13/2019, 8:44 AM

## 2019-08-14 ENCOUNTER — Telehealth: Payer: Self-pay | Admitting: Neurology

## 2019-08-14 DIAGNOSIS — R202 Paresthesia of skin: Secondary | ICD-10-CM

## 2019-08-14 MED ORDER — GABAPENTIN 600 MG PO TABS: 600.0000 mg | ORAL_TABLET | Freq: Four times a day (QID) | ORAL | 3 refills | Status: DC

## 2019-08-14 MED ORDER — NORTRIPTYLINE HCL 10 MG PO CAPS: ORAL_CAPSULE | ORAL | 3 refills | Status: DC

## 2019-08-14 NOTE — Telephone Encounter (Signed)
This was addressed in mychart from SS/NP.

## 2019-08-14 NOTE — Telephone Encounter (Signed)
I called pt and relayed the information to her re: all of th above.  She had already gotten contact from GI and she will discuss with then about her [employer] and network.  I relayed her nortriptyline and gabapentin called in.  She stated she is trying to do everything she is asking and I relayed it is a 2 way communication to let us know how she is doing and proceed from there.

## 2019-08-14 NOTE — Telephone Encounter (Signed)
MY CHART MESSAGE: Good morning Mrs. Rebecca Oconnor, Thank you responding back to me. Yes i am getting  some Cholesterol medicine and yes i do have the information for my MRI. You would need to speak with them and they would schedule the MRI. the number is [phone] Her name is Jerrye Beavers and this is her direct line and the company name is [employer] and she is my Scientist, physiological.  Could you please release some more gabapentin for me also. I really have been trying to do what you have ask. Thank you so very much.  I received the above message from Ms. Sharma Covert. I will order MRI of the brain with and without contrast, please see it is processed as she mentions above.   I will fill gabapentin, but I think she should stay at 600 mg 4 times daily, she has some leg swelling which could be coming from the gabapentin.   She likely needs to be on something else to control the pain in her legs. I do not think she will take cymbalta, could not tolerate? I will send in nortriptyline to take at bedtime, take 10 mg x 1 week, then take 20 mg at bedtime. I wouldn't consider carbamazepine at this time, because liver enzymes where elevated in December. May consider Keppra for pain?  Make sure she avoids ETOH, could be contributing to her symptoms, she says things got better when she stopped drinking red wine.

## 2019-08-14 NOTE — Telephone Encounter (Signed)
[employer] order sent to [facility]. They will obtain the auth and reach out to the patient to schedule.  

## 2019-08-20 ENCOUNTER — Ambulatory Visit: Payer: Managed Care, Other (non HMO) | Admitting: Neurology

## 2019-08-22 ENCOUNTER — Ambulatory Visit: Payer: Managed Care, Other (non HMO) | Admitting: Physician Assistant

## 2019-09-23 ENCOUNTER — Other Ambulatory Visit: Payer: Self-pay

## 2019-09-23 MED ORDER — VALSARTAN-HYDROCHLOROTHIAZIDE 320-25 MG PO TABS: 1.0000 | ORAL_TABLET | Freq: Every day | ORAL | 1 refills | Status: DC

## 2019-09-23 MED ORDER — AMLODIPINE BESYLATE 5 MG PO TABS: ORAL_TABLET | ORAL | 1 refills | Status: DC

## 2019-09-30 ENCOUNTER — Encounter: Payer: Self-pay | Admitting: Certified Nurse Midwife

## 2019-10-30 ENCOUNTER — Encounter: Payer: Self-pay | Admitting: Gastroenterology

## 2019-11-19 ENCOUNTER — Telehealth: Payer: Self-pay | Admitting: Neurology

## 2019-11-19 NOTE — Telephone Encounter (Signed)
Pt called requesting a CB from RN to discuss upcoming apt. States she would like to see NP. Per last OV notes it looks like FU was set for MD. However, MD next available is in oct.

## 2019-11-20 ENCOUNTER — Ambulatory Visit: Payer: No Typology Code available for payment source | Admitting: Neurology

## 2019-11-20 NOTE — Telephone Encounter (Signed)
I can see her, but have her keep appointment with Dr. Anne Hahn, I would prefer she gets her MRI, before I see her again.

## 2019-11-20 NOTE — Telephone Encounter (Signed)
I LMVM for pt to return call, SS/NP ok to see her, still keep appt with Dr. Anne Hahn.  Would like MRI done prior to seeing her though.

## 2019-11-21 ENCOUNTER — Other Ambulatory Visit: Payer: Self-pay | Admitting: Neurology

## 2019-11-21 NOTE — Telephone Encounter (Signed)
Spoke to pt.  She will get MRI scheduled at GI and will call us to schedule appt with SS/NP.  Pt verbalized understanding.

## 2019-12-06 ENCOUNTER — Telehealth: Payer: Self-pay | Admitting: *Deleted

## 2019-12-06 NOTE — Telephone Encounter (Signed)
No-show for pre-visit.  Message left asking patient to call and reschedule pre-visit to avoid cancellation of up-coming colonoscopy on 12/20/2019

## 2019-12-11 NOTE — Telephone Encounter (Signed)
[employer] pending fax notes to fax # [phone]. For right now I put GI as the location because she is scheduled there for 12/28/19.   But patient wants to go to a place [employer]  Case worker Alisa ph # [phone] & fax # [phone].

## 2019-12-12 NOTE — Telephone Encounter (Signed)
Alyssa called me back and informed me that she will schedule the MRI for the patient because if they go through [facility] then its covered at a 100%. [facility] is what is in the patient network for that.  I faxed the order to her and she will contact the patient to schedule it. Phone number [phone] & fax # [phone].

## 2019-12-12 NOTE — Telephone Encounter (Signed)
I left a voicemail for Alisa patient caseworker to give me a call back.

## 2019-12-13 NOTE — Telephone Encounter (Signed)
Patient is scheduled at [facility]. For 12/28/19.

## 2019-12-16 NOTE — Telephone Encounter (Signed)
[facility]: [id number] (exp. 12/11/19 to 03/12/20)

## 2019-12-16 NOTE — Telephone Encounter (Signed)
Schedule for 12/24/19.

## 2019-12-18 ENCOUNTER — Other Ambulatory Visit: Payer: Self-pay | Admitting: *Deleted

## 2019-12-18 ENCOUNTER — Other Ambulatory Visit: Payer: Self-pay | Admitting: Neurology

## 2019-12-18 MED ORDER — GABAPENTIN 600 MG PO TABS: 600.0000 mg | ORAL_TABLET | Freq: Four times a day (QID) | ORAL | 3 refills | Status: DC

## 2019-12-18 NOTE — Telephone Encounter (Signed)
This was filled

## 2019-12-18 NOTE — Telephone Encounter (Signed)
Patient calling she needs refill on Gabapentin until her apt sin July with sarah. [facility]  [address] / [place] .

## 2019-12-20 ENCOUNTER — Encounter: Payer: Managed Care, Other (non HMO) | Admitting: Gastroenterology

## 2019-12-25 ENCOUNTER — Telehealth: Payer: Self-pay | Admitting: Neurology

## 2019-12-25 NOTE — Telephone Encounter (Signed)
The reports is positive- no intracranial abnormalities, no vascular abnormalities. Mild , few white matter lesions as they can arise in healthy individuals over age 52 (and earlier in patients with  migraine, DM, HTN ).

## 2019-12-25 NOTE — Telephone Encounter (Signed)
Results have came in from [facility] for MRI of head with and without contrast. Dr Anne Hahn and Maralyn Sago are out of the office and we will have the work in MD review the results and contact the patient with the result.

## 2019-12-26 NOTE — Telephone Encounter (Signed)
I called pt and gave her the results of MRI brain, per Dr. Vickey Huger [facility]).  Pt reassured but wondering what is causing numbness. Taking gabapentin every 6-7 hours for tingling numbness in legs, feet.  Has appt 01-09-20. She verbalized understanding.

## 2019-12-28 ENCOUNTER — Other Ambulatory Visit: Payer: No Typology Code available for payment source

## 2020-01-09 ENCOUNTER — Encounter: Payer: Self-pay | Admitting: Neurology

## 2020-01-09 ENCOUNTER — Other Ambulatory Visit: Payer: Self-pay

## 2020-01-09 ENCOUNTER — Ambulatory Visit (INDEPENDENT_AMBULATORY_CARE_PROVIDER_SITE_OTHER): Payer: No Typology Code available for payment source | Admitting: Neurology

## 2020-01-09 VITALS — BP 107/74 | HR 109 | Ht 66.5 in | Wt 179.2 lb

## 2020-01-09 DIAGNOSIS — R202 Paresthesia of skin: Secondary | ICD-10-CM | POA: Diagnosis not present

## 2020-01-09 DIAGNOSIS — G603 Idiopathic progressive neuropathy: Secondary | ICD-10-CM | POA: Diagnosis not present

## 2020-01-09 NOTE — Progress Notes (Signed)
PATIENT: Rebecca Oconnor DOB: 09/09/1965  REASON FOR VISIT: follow up HISTORY FROM: patient  HISTORY OF PRESENT ILLNESS: Today 01/09/20  Rebecca Oconnor is a 54 year old female with history of sensory alterations that began in the feet and gradually worked up to include the entire legs, up the body to the mid chest level.  She is on gabapentin for sensation of her legs tightening up, cramping.  She has improvement in symptoms with not drinking red wine.  She started nortriptyline 20 mg qhs, stopped it, didn't think she needed it.  She was sent for MRI of the brain, showed no intracranial abnormalities no vascular abnormalities.  Mild, few white matter lesions.  Indicates overall she is doing much better, continues to have sensation of numbness from the knees down, will have sensation of tightening, cramping in her legs.  No longer has the sensory alteration up to her chest. Walking and balance is better, no longer wears heels, no more swelling in her legs.  Overall, feels her condition is much improved.  HISTORY 08/12/2019 SS: Rebecca Oconnor 54 year old female with history of sensory alterations that began in the feet and gradually worked up to include the entire legs and up to the body to the mid chest level.  She was hospitalized 07/04/2019 for hyponatremia, elevated creatinine.  Previously she had a EMG nerve conduction evaluation that suggested a mild peripheral neuropathy, evidence of right carpal tunnel syndrome, a denervation pattern that could be consistent with a right S1 radiculopathy.  Blood work has revealed an elevated angiotensin-converting enzyme, but she is a smoker.  She has recently complained of sensation of her legs tightening up, cramping.  This sensation has greatly helped by gabapentin, to the point she was taking too much medication.  She saw her PCP today, had lab work done.  She says in November and December, she was eating poorly, drinking excess red wine.  She has since cleaned up her  diet, is no longer drinking red wine.  She is overall feeling better, but has not changed her sensation in her legs.  She denies sensory alteration from her knees up, but reports numbness from her knees down, sensation of sharp pains, cramping.  She feels her legs are tight.  She has some swelling in her ankles.  When she takes gabapentin, her pain goes away.  She denies back pain.  She denies any changes to her vision, or bowels or bladder.  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  She tried Cymbalta, did not like the way it made her feel.  She presents today for evaluation unaccompanied.   REVIEW OF SYSTEMS: Out of a complete 14 system review of symptoms, the patient complains only of the following symptoms, and all other reviewed systems are negative.  Numbness  ALLERGIES: No Known Allergies  HOME MEDICATIONS: Outpatient Medications Prior to Visit  Medication Sig Dispense Refill  . amLODipine (NORVASC) 5 MG tablet TK 1 T PO QD 90 tablet 1  . cholecalciferol (VITAMIN D3) 25 MCG (1000 UNIT) tablet Take 1,000 Units by mouth daily.    Marland Kitchen gabapentin (NEURONTIN) 600 MG tablet Take 1 tablet (600 mg total) by mouth 4 (four) times daily. 120 tablet 3  . omeprazole (PRILOSEC) 20 MG capsule Take 1 capsule (20 mg total) by mouth daily. 30 capsule 2  . rosuvastatin (CRESTOR) 10 MG tablet Take 1 tablet (10 mg total) by mouth daily. 90 tablet 3  . valsartan-hydrochlorothiazide (DIOVAN-HCT) 320-25 MG tablet Take 1  tablet by mouth daily. 90 tablet 1  . nortriptyline (PAMELOR) 10 MG capsule Take 1 at bedtime x 1 week, then take 2 60 capsule 3   No facility-administered medications prior to visit.    PAST MEDICAL HISTORY: Past Medical History:  Diagnosis Date  . 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 Hypertension   . Peripheral neuropathy 11/22/2018  . Right carpal tunnel syndrome 11/22/2018  . STD (sexually transmitted disease)    trichomonas  treated 06/2019 & 07/2019    PAST SURGICAL HISTORY: Past Surgical History:  Procedure Laterality Date  . FINGER SURGERY     partial amputation    FAMILY HISTORY: Family History  Problem Relation Age of Onset  . Hypertension Mother   . Heart disease Mother   . Hypertension Father   . Hypertension Sister   . Hypertension Sister     SOCIAL HISTORY: Social History   Socioeconomic History  . Marital status: Single    Spouse name: Not on file  . Number of children: 0  . Years of education: Not on file  . Highest education level: Some college, no degree  Occupational History    Comment: [employer]  Tobacco Use  . Smoking status: Current Every Day Smoker  . Smokeless tobacco: Never Used  . Tobacco comment: 3 a day  Substance and Sexual Activity  . Alcohol use: Yes    Comment: occ  . Drug use: Not Currently  . Sexual activity: Not Currently    Partners: Male    Birth control/protection: Abstinence, Post-menopausal  Other Topics Concern  . Not on file  Social History Narrative  . Not on file   Social Determinants of Health   Financial Resource Strain:   . Difficulty of Paying Living Expenses:   Food Insecurity:   . Worried About Programme researcher, broadcasting/film/videounning Out of Food in the Last Year:   . Baristaan Out of Food in the Last Year:   Transportation Needs:   . Freight forwarderLack of Transportation (Medical):   Marland Kitchen. Lack of Transportation (Non-Medical):   Physical Activity:   . Days of Exercise per Week:   . Minutes of Exercise per Session:   Stress:   . Feeling of Stress :   Social Connections:   . Frequency of Communication with Friends and Family:   . Frequency of Social Gatherings with Friends and Family:   . Attends Religious Services:   . Active Member of Clubs or Organizations:   . Attends BankerClub or Organization Meetings:   Marland Kitchen. Marital Status:   Intimate Partner Violence:   . Fear of Current or Ex-Partner:   . Emotionally Abused:   Marland Kitchen. Physically Abused:   . Sexually Abused:       PHYSICAL  EXAM  Vitals:   01/09/20 1234  BP: 107/74  Pulse: (!) 109  Weight: 179 lb 3.2 oz (81.3 kg)  Height: 5' 6.5" (1.689 m)   Body mass index is 28.49 kg/m.  Generalized: Well developed, in no acute distress   Neurological examination  Mentation: Alert oriented to time, place, history taking. Follows all commands speech and language fluent Cranial nerve II-XII: Pupils were equal round reactive to light. Extraocular movements were full, visual field were full on confrontational test. Facial sensation and strength were normal.  Head turning and shoulder shrug  were normal and symmetric. Motor: The motor testing reveals 5 over 5 strength of all 4 extremities. Good symmetric motor tone is noted  throughout.  Sensory: Decreased sensation to soft touch, pinprick up to mid shin Coordination: Cerebellar testing reveals good finger-nose-finger and heel-to-shin bilaterally.  Gait and station: Gait is normal. Tandem gait is normal. Romberg is negative. No drift is seen.  Reflexes: Deep tendon reflexes are symmetric and normal bilaterally.   DIAGNOSTIC DATA (LABS, IMAGING, TESTING) - I reviewed patient records, labs, notes, testing and imaging myself where available.  Lab Results  Component Value Date   WBC 8.4 08/12/2019   HGB 11.8 08/12/2019   HCT 34.4 08/12/2019   MCV 92 08/12/2019   PLT 455 (H) 08/12/2019      Component Value Date/Time   NA 145 (H) 08/12/2019 1454   K 4.9 08/12/2019 1454   CL 105 08/12/2019 1454   CO2 23 08/12/2019 1454   GLUCOSE 79 08/12/2019 1454   GLUCOSE 116 (H) 07/04/2019 0341   BUN 11 08/12/2019 1454   CREATININE 0.85 08/12/2019 1454   CALCIUM 10.4 (H) 08/12/2019 1454   PROT 7.2 08/12/2019 1454   ALBUMIN 4.3 08/12/2019 1454   AST 22 08/12/2019 1454   ALT 11 08/12/2019 1454   ALKPHOS 65 08/12/2019 1454   BILITOT 0.4 08/12/2019 1454   GFRNONAA 80 08/12/2019 1454   GFRAA 92 08/12/2019 1454   Lab Results  Component Value Date   CHOL 272 (H) 08/12/2019    HDL 72 08/12/2019   LDLCALC 145 (H) 08/12/2019   TRIG 304 (H) 08/12/2019   CHOLHDL 3.8 08/12/2019   Lab Results  Component Value Date   HGBA1C 5.1 08/08/2018   Lab Results  Component Value Date   VITAMINB12 945 08/08/2018   Lab Results  Component Value Date   TSH 3.268 07/03/2019   ASSESSMENT AND PLAN 54 y.o. year old female  has a past medical history of HSV-2 (herpes simplex virus 2) infection, Hypertension, Peripheral neuropathy (11/22/2018), Right carpal tunnel syndrome (11/22/2018), and STD (sexually transmitted disease). here with:  1.  Numbness 2.  Bilateral lower extremity paresthesia  Overall, her condition has greatly improved, no longer has sensory alteration in her chest.  Her symptoms now are isolated to numbness below the knees, cramping in her calves, well controlled with gabapentin 600 mg 4 times a day.  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  She was sent for MRI of the brain, overall, no intracranial abnormalities, did show few white matter lesions.  We have previously considered lumbar puncture-consideration of sarcoidosis has been entertained.  Since her condition has improved, she is not sure she wants to pursue this route.  She can check with her insurance company, let me know if is covered.  Nerve conduction study has shown evidence of mild peripheral neuropathy.  She will keep her follow-up appointment in October with Dr. Anne Hahn, if she is agreeable, I think it is reasonable to pursue lumbar puncture.  She has had an elevated angiotensin-converting enzyme, but she is a smoker.   MRI of the brain with and without contrast December 24, 2019  1. No acute intracranial abnormality.  2. Few small discrete white matter lesions identified in the LEFT cerebrum. These lesions are abnormal but nonspecific, usually resulting from benign/remote/incidental causes (e.g. prior  trauma/inflammation/demyelinization, or chronic ischemia associated with migraines/atherosclerosis/other vasculopathies.   I spent 30 minutes of face-to-face and non-face-to-face time with patient.  This included previsit chart review, lab review, study review, order entry, electronic health record documentation, patient education.  Margie Ege, AGNP-C,  DNP 01/09/2020, 1:17 PM [facility] [address], [address] [place], [place] [address] [phone]

## 2020-01-09 NOTE — Patient Instructions (Addendum)
Continue gabapentin at current dosing Check with your insurance if Lumbar Puncture If they cover it I think we should order it  Keep your appointment with Dr. Anne Hahn in October

## 2020-01-10 NOTE — Progress Notes (Signed)
I have read the note, and I agree with the clinical assessment and plan.  Arun Herrod K Jeralynn Vaquera   

## 2020-02-12 ENCOUNTER — Ambulatory Visit: Payer: Managed Care, Other (non HMO) | Admitting: Internal Medicine

## 2020-02-20 ENCOUNTER — Encounter: Payer: Self-pay | Admitting: Internal Medicine

## 2020-02-20 ENCOUNTER — Telehealth (INDEPENDENT_AMBULATORY_CARE_PROVIDER_SITE_OTHER): Payer: No Typology Code available for payment source | Admitting: Internal Medicine

## 2020-02-20 DIAGNOSIS — K219 Gastro-esophageal reflux disease without esophagitis: Secondary | ICD-10-CM | POA: Diagnosis not present

## 2020-02-20 DIAGNOSIS — E782 Mixed hyperlipidemia: Secondary | ICD-10-CM

## 2020-02-20 DIAGNOSIS — I10 Essential (primary) hypertension: Secondary | ICD-10-CM

## 2020-02-20 DIAGNOSIS — E785 Hyperlipidemia, unspecified: Secondary | ICD-10-CM | POA: Diagnosis not present

## 2020-02-20 MED ORDER — ROSUVASTATIN CALCIUM 10 MG PO TABS: 10.0000 mg | ORAL_TABLET | Freq: Every day | ORAL | 3 refills | Status: AC

## 2020-02-20 MED ORDER — OMEPRAZOLE 20 MG PO CPDR: 20.0000 mg | DELAYED_RELEASE_CAPSULE | Freq: Every day | ORAL | 1 refills | Status: AC

## 2020-02-20 NOTE — Progress Notes (Signed)
Virtual Visit via Telephone Note  I connected with Rebecca Oconnor, on 02/20/2020 at 3:53 PM by telephone due to the COVID-19 pandemic and verified that I am speaking with the correct person using two identifiers.   Consent: I discussed the limitations, risks, security and privacy concerns of performing an evaluation and management service by telephone and the availability of in person appointments. I also discussed with the patient that there may be a patient responsible charge related to this service. The patient expressed understanding and agreed to proceed.   Location of Patient: Home   Location of Provider: Clinic    Persons participating in Telemedicine visit: Hayzel M Louella Medaglia [facility] [address] Dr. Earlene Plater      History of Present Illness: Patient has a visit for chronic medical conditions.   Chronic HTN Disease Monitoring:  Home BP Monitoring - 110-120/70-80s  Chest pain- no  Dyspnea- no Headache - no  Medications: Valsartan 320 mg, HCTZ 25 mg, Amlodipine 5 mg  Compliance- yes Lightheadedness- no  Edema- no     Past Medical History:  Diagnosis Date  . 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 Hypertension   . Peripheral neuropathy 11/22/2018  . Right carpal tunnel syndrome 11/22/2018  . STD (sexually transmitted disease)    trichomonas treated 06/2019 & 07/2019   No Known Allergies  Current Outpatient Medications on File Prior to Visit  Medication Sig Dispense Refill  . amLODipine (NORVASC) 5 MG tablet TK 1 T PO QD 90 tablet 1  . cholecalciferol (VITAMIN D3) 25 MCG (1000 UNIT) tablet Take 1,000 Units by mouth daily.    Marland Kitchen gabapentin (NEURONTIN) 600 MG tablet Take 1 tablet (600 mg total) by mouth 4 (four) times daily. 120 tablet 3  . omeprazole (PRILOSEC) 20 MG capsule Take 1 capsule (20 mg total) by mouth daily. 30 capsule 2  . rosuvastatin (CRESTOR) 10 MG tablet Take 1 tablet (10 mg total) by mouth daily.  90 tablet 3  . valsartan-hydrochlorothiazide (DIOVAN-HCT) 320-25 MG tablet Take 1 tablet by mouth daily. 90 tablet 1   No current facility-administered medications on file prior to visit.    Observations/Objective: NAD. Speaking clearly.  Work of breathing normal.  Alert and oriented. Mood appropriate.   Assessment and Plan: 1. Essential hypertension BP well controlled. Asymptomatic. Continue current regimen.   2. Mixed hyperlipidemia Compliant with Crestor. This was started in Feb 2021 due to ASCVD risk of 9%. LDL was 145. Will monitor at future visits.   3. Gastroesophageal reflux disease, unspecified whether esophagitis present - omeprazole (PRILOSEC) 20 MG capsule; Take 1 capsule (20 mg total) by mouth daily.  Dispense: 90 capsule; Refill: 1  Follow Up Instructions: PRN and for routine care    I discussed the assessment and treatment plan with the patient. The patient was provided an opportunity to ask questions and all were answered. The patient agreed with the plan and demonstrated an understanding of the instructions.   The patient was advised to call back or seek an in-person evaluation if the symptoms worsen or if the condition fails to improve as anticipated.     I provided 12 minutes total of non-face-to-face time during this encounter including median intraservice time, reviewing previous notes, investigations, ordering medications, medical decision making, coordinating care and patient verbalized understanding at the end of the visit.    Marcy Siren, D.O. Primary Care at [facility]  02/20/2020, 3:53 PM

## 2020-03-03 ENCOUNTER — Telehealth: Payer: Self-pay | Admitting: Neurology

## 2020-03-03 NOTE — Telephone Encounter (Addendum)
Called pt back to further discuss. Patient reports she has dropped her pill bottles "so many times" when I asked if she is taking more than 1 cap po QID. I asked her to clarify, she lost some of her medication. She is due for a refill tomorrow. She is down to two gabapentin. Worried about running out of her medication tonight. I spoke with Maralyn Sago and ok to call pharmacy to authorize early fill for today. I relayed this to pt. Advised I will call pharmacy and call her back after. I spoke with tech. She states they cannot authorize early refill. She last picked up prescription on 02/10/20. Tomorrow is the earliest she can refill it due to her insurance. I called pt back to let her know. She verbalized understanding and appreciation.

## 2020-03-03 NOTE — Telephone Encounter (Signed)
Rebecca Oconnor, Rebecca Oconnor called stating she is down to about 2 of her gabapentin (NEURONTIN) 300 MG capsule, she said her refill is not due until tomorrow.  Pt is asking if medication can be called in for she does not run short until she is able to get her refill.  Please call (pt using-[facility] [phone] )

## 2020-03-05 ENCOUNTER — Ambulatory Visit: Payer: No Typology Code available for payment source

## 2020-03-20 ENCOUNTER — Other Ambulatory Visit: Payer: Self-pay

## 2020-03-20 MED ORDER — VALSARTAN-HYDROCHLOROTHIAZIDE 320-25 MG PO TABS: 1.0000 | ORAL_TABLET | Freq: Every day | ORAL | 1 refills | Status: AC

## 2020-03-20 MED ORDER — AMLODIPINE BESYLATE 5 MG PO TABS: 5.0000 mg | ORAL_TABLET | Freq: Every day | ORAL | 1 refills | Status: AC

## 2020-03-25 ENCOUNTER — Telehealth: Payer: Self-pay | Admitting: Neurology

## 2020-03-25 MED ORDER — GABAPENTIN 600 MG PO TABS: 600.0000 mg | ORAL_TABLET | Freq: Four times a day (QID) | ORAL | 3 refills | Status: AC

## 2020-03-25 NOTE — Telephone Encounter (Signed)
I refilled the gabapentin as is due, last fill 03-04-20 per pharmacist.

## 2020-03-25 NOTE — Telephone Encounter (Signed)
Pt called [id number] sent Pt a message too soon to refill gabapentin (NEURONTIN) 600 MG tablet. Pt would like a call from the nurse.

## 2020-03-25 NOTE — Telephone Encounter (Signed)
Pt is needing a refill on her gabapentin (NEURONTIN) 600 MG tablet sent in to the [facility] on [address].

## 2020-03-25 NOTE — Telephone Encounter (Signed)
I spoke to pt.  She has one tablet of gabapentin left and was trying to get samples until Friday.  I relayed that we do not have samples. She was ok to wait until Friday.  She is taking 600mg  po qid, sometimes will take more if needed.  (that is why she is needing refills sooner).  Her last appt she saw SS/NP was doing ok.  She was ok to wait.

## 2020-03-26 ENCOUNTER — Ambulatory Visit: Payer: No Typology Code available for payment source

## 2020-04-08 ENCOUNTER — Ambulatory Visit: Payer: No Typology Code available for payment source

## 2020-04-17 ENCOUNTER — Other Ambulatory Visit: Payer: Self-pay

## 2020-04-17 ENCOUNTER — Ambulatory Visit (HOSPITAL_COMMUNITY)
Admission: EM | Admit: 2020-04-17 | Discharge: 2020-04-17 | Disposition: A | Discharge status: Home / Self Care | Payer: No Typology Code available for payment source | Attending: Internal Medicine | Admitting: Internal Medicine

## 2020-04-17 ENCOUNTER — Encounter (HOSPITAL_COMMUNITY): Payer: Self-pay

## 2020-04-17 DIAGNOSIS — R059 Cough, unspecified: Secondary | ICD-10-CM | POA: Diagnosis not present

## 2020-04-17 DIAGNOSIS — J029 Acute pharyngitis, unspecified: Secondary | ICD-10-CM

## 2020-04-17 DIAGNOSIS — B349 Viral infection, unspecified: Secondary | ICD-10-CM

## 2020-04-17 DIAGNOSIS — Z1152 Encounter for screening for COVID-19: Secondary | ICD-10-CM | POA: Diagnosis present

## 2020-04-17 MED ORDER — FLUTICASONE PROPIONATE 50 MCG/ACT NA SUSP: 2.0000 | Freq: Every day | NASAL | 0 refills | Status: AC

## 2020-04-17 MED ORDER — CETIRIZINE-PSEUDOEPHEDRINE ER 5-120 MG PO TB12: 1.0000 | ORAL_TABLET | Freq: Every day | ORAL | 0 refills | Status: AC

## 2020-04-17 NOTE — ED Provider Notes (Signed)
[facility]   [id number] 04/17/20 Arrival Time: 1646   CC: COVID symptoms  SUBJECTIVE: History from: patient.  Rebecca Oconnor is a 54 y.o. female who presents with abrupt onset of nasal congestion, PND, and persistent dry cough for the last 2 days.  Has negative history of Covid.  Has completed Covid vaccines.. Denies sick exposure to COVID, flu or strep. Denies recent travel. Has not taken OTC medications for this. There are no aggravating or alleviating factors. Denies previous symptoms in the past. Denies fever, chills, fatigue, sinus pain, rhinorrhea, SOB, wheezing, chest pain, nausea, changes in bowel or bladder habits.    ROS: As per HPI.  All other pertinent ROS negative.     Past Medical History:  Diagnosis Date  . HSV-2 (herpes simplex virus 2) infection    contracted at age 42, no reoccurence  . Hypertension   . Peripheral neuropathy 11/22/2018  . Right carpal tunnel syndrome 11/22/2018  . STD (sexually transmitted disease)    trichomonas treated 06/2019 & 07/2019   Past Surgical History:  Procedure Laterality Date  . FINGER SURGERY     partial amputation   No Known Allergies No current facility-administered medications on file prior to encounter.   Current Outpatient Medications on File Prior to Encounter  Medication Sig Dispense Refill  . amLODipine (NORVASC) 5 MG tablet Take 1 tablet (5 mg total) by mouth daily. 90 tablet 1  . cholecalciferol (VITAMIN D3) 25 MCG (1000 UNIT) tablet Take 1,000 Units by mouth daily.    Marland Kitchen gabapentin (NEURONTIN) 600 MG tablet Take 1 tablet (600 mg total) by mouth 4 (four) times daily. 120 tablet 3  . omeprazole (PRILOSEC) 20 MG capsule Take 1 capsule (20 mg total) by mouth daily. 90 capsule 1  . rosuvastatin (CRESTOR) 10 MG tablet Take 1 tablet (10 mg total) by mouth daily. 90 tablet 3  . valsartan-hydrochlorothiazide (DIOVAN-HCT) 320-25 MG tablet Take 1 tablet by mouth daily. 90 tablet 1   Social History   Socioeconomic  History  . Marital status: Single    Spouse name: Not on file  . Number of children: 0  . Years of education: Not on file  . Highest education level: Some college, no degree  Occupational History    Comment: [employer]  Tobacco Use  . Smoking status: Current Every Day Smoker  . Smokeless tobacco: Never Used  . Tobacco comment: 3 a day  Substance and Sexual Activity  . Alcohol use: Yes    Comment: occ  . Drug use: Not Currently  . Sexual activity: Not Currently    Partners: Male    Birth control/protection: Abstinence, Post-menopausal  Other Topics Concern  . Not on file  Social History Narrative  . Not on file   Social Determinants of Health   Financial Resource Strain:   . Difficulty of Paying Living Expenses: Not on file  Food Insecurity:   . Worried About Programme researcher, broadcasting/film/video in the Last Year: Not on file  . Ran Out of Food in the Last Year: Not on file  Transportation Needs:   . Lack of Transportation (Medical): Not on file  . Lack of Transportation (Non-Medical): Not on file  Physical Activity:   . Days of Exercise per Week: Not on file  . Minutes of Exercise per Session: Not on file  Stress:   . Feeling of Stress : Not on file  Social Connections:   . Frequency of Communication with Friends and Family: Not  on file  . Frequency of Social Gatherings with Friends and Family: Not on file  . Attends Religious Services: Not on file  . Active Member of Clubs or Organizations: Not on file  . Attends Banker Meetings: Not on file  . Marital Status: Not on file  Intimate Partner Violence:   . Fear of Current or Ex-Partner: Not on file  . Emotionally Abused: Not on file  . Physically Abused: Not on file  . Sexually Abused: Not on file   Family History  Problem Relation Age of Onset  . Hypertension Mother   . Heart disease Mother   . Hypertension Father   . Hypertension Sister   . Hypertension Sister     OBJECTIVE:  Vitals:   04/17/20 1804    BP: 116/83  Pulse: 97  Resp: 16  Temp: 98.6 F (37 C)  TempSrc: Oral  SpO2: 99%     General appearance: alert; appears fatigued, but nontoxic; speaking in full sentences and tolerating own secretions HEENT: NCAT; Ears: EACs clear, TMs pearly gray; Eyes: PERRL.  EOM grossly intact. Sinuses: nontender; Nose: nares patent without rhinorrhea, Throat: oropharynx erythematous, cobblestoning present, tonsils non erythematous or enlarged, uvula midline  Neck: supple without LAD Lungs: unlabored respirations, symmetrical air entry; cough: absent; no respiratory distress; CTAB Heart: regular rate and rhythm.  Radial pulses 2+ symmetrical bilaterally Skin: warm and dry Psychological: alert and cooperative; normal mood and affect  LABS:  No results found for this or any previous visit (from the past 24 hour(s)).   ASSESSMENT & PLAN:  1. Viral illness   2. Cough   3. Sore throat   4. Encounter for screening for COVID-19     Meds ordered this encounter  Medications  . fluticasone (FLONASE) 50 MCG/ACT nasal spray    Sig: Place 2 sprays into both nostrils daily.    Dispense:  9.9 mL    Refill:  0    Order Specific Question:   Supervising Provider    Answer:   Merrilee Jansky [id number]  . cetirizine-pseudoephedrine (ZYRTEC-D) 5-120 MG tablet    Sig: Take 1 tablet by mouth daily.    Dispense:  30 tablet    Refill:  0    Order Specific Question:   Supervising Provider    Answer:   Merrilee Jansky [[id number]]   Prescribed Flonase Prescribed Zyrtec-D Work note provided   COVID testing ordered.  It will take between 1-2 days for test results.  Someone will contact you regarding abnormal results.    Patient should remain in quarantine until they have received Covid results.  If negative you may resume normal activities (go back to work/school) while practicing hand hygiene, social distance, and mask wearing.  If positive, patient should remain in quarantine for 10 days from symptom  onset AND greater than 72 hours after symptoms resolution (absence of fever without the use of fever-reducing medication and improvement in respiratory symptoms), whichever is longer Get plenty of rest and push fluids Use OTC zyrtec for nasal congestion, runny nose, and/or sore throat Use OTC flonase for nasal congestion and runny nose Use medications daily for symptom relief Use OTC medications like ibuprofen or tylenol as needed fever or pain Call or go to the ED if you have any new or worsening symptoms such as fever, worsening cough, shortness of breath, chest tightness, chest pain, turning blue, changes in mental status.  Reviewed expectations re: course of current medical issues. Questions answered. Outlined signs  and symptoms indicating need for more acute intervention. Patient verbalized understanding. After Visit Summary given.         Moshe Cipro, NP 04/17/20 [phone]

## 2020-04-17 NOTE — ED Triage Notes (Signed)
Pt presents today with sore throat and productive cough that has been going on since yesterday. Work wanted her to come get tested for COVID. She has not had any fever or other sxs. Has not taken any OTC medications.

## 2020-04-17 NOTE — Discharge Instructions (Signed)
Your COVID test is pending.  You should self quarantine until the test result is back.    Take Tylenol as needed for fever or discomfort.  Rest and keep yourself hydrated.    Go to the emergency department if you develop acute worsening symptoms.     

## 2020-04-18 LAB — SARS CORONAVIRUS 2 (TAT 6-24 HRS): SARS Coronavirus 2: POSITIVE — AB

## 2020-04-19 ENCOUNTER — Encounter: Payer: Self-pay | Admitting: Physician Assistant

## 2020-04-19 ENCOUNTER — Other Ambulatory Visit (HOSPITAL_COMMUNITY): Payer: Self-pay | Admitting: Physician Assistant

## 2020-04-19 DIAGNOSIS — U071 COVID-19: Secondary | ICD-10-CM

## 2020-04-19 NOTE — Progress Notes (Signed)
I connected by phone with Rebecca Oconnor on 04/19/2020 at 11:08 AM to discuss the potential use of a new treatment for mild to moderate COVID-19 viral infection in non-hospitalized patients.  This patient is a 54 y.o. female that meets the FDA criteria for Emergency Use Authorization of COVID monoclonal antibody casirivimab/imdevimab or bamlanivimab/eteseviamb.  Has a (+) direct SARS-CoV-2 viral test result  Has mild or moderate COVID-19   Is NOT hospitalized due to COVID-19  Is within 10 days of symptom onset  Has at least one of the high risk factor(s) for progression to severe COVID-19 and/or hospitalization as defined in EUA.  Specific high risk criteria : Cardiovascular disease or hypertension   I have spoken and communicated the following to the patient or parent/caregiver regarding COVID monoclonal antibody treatment:  1. FDA has authorized the emergency use for the treatment of mild to moderate COVID-19 in adults and pediatric patients with positive results of direct SARS-CoV-2 viral testing who are 34 years of age and older weighing at least 40 kg, and who are at high risk for progressing to severe COVID-19 and/or hospitalization.  2. The significant known and potential risks and benefits of COVID monoclonal antibody, and the extent to which such potential risks and benefits are unknown.  3. Information on available alternative treatments and the risks and benefits of those alternatives, including clinical trials.  4. Patients treated with COVID monoclonal antibody should continue to self-isolate and use infection control measures (e.g., wear mask, isolate, social distance, avoid sharing personal items, clean and disinfect "high touch" surfaces, and frequent handwashing) according to CDC guidelines.   5. The patient or parent/caregiver has the option to accept or refuse COVID monoclonal antibody treatment.  After reviewing this information with the patient, the patient has agreed  to receive one of the available covid 19 monoclonal antibodies and will be provided an appropriate fact sheet prior to infusion. Jodell Cipro, PA-C 04/19/2020 11:08 AM

## 2020-04-20 ENCOUNTER — Ambulatory Visit (HOSPITAL_COMMUNITY)
Admission: RE | Admit: 2020-04-20 | Discharge: 2020-04-20 | Disposition: A | Discharge status: Home / Self Care | Payer: No Typology Code available for payment source | Source: Ambulatory Visit | Attending: Pulmonary Disease | Admitting: Pulmonary Disease

## 2020-04-20 DIAGNOSIS — U071 COVID-19: Secondary | ICD-10-CM | POA: Insufficient documentation

## 2020-04-20 DIAGNOSIS — I10 Essential (primary) hypertension: Secondary | ICD-10-CM | POA: Diagnosis not present

## 2020-04-20 MED ORDER — EPINEPHRINE 0.3 MG/0.3ML IJ SOAJ: 0.3000 mg | Freq: Once | INTRAMUSCULAR | Status: DC | PRN

## 2020-04-20 MED ORDER — METHYLPREDNISOLONE SODIUM SUCC 125 MG IJ SOLR: 125.0000 mg | Freq: Once | INTRAMUSCULAR | Status: DC | PRN

## 2020-04-20 MED ORDER — FAMOTIDINE IN NACL 20-0.9 MG/50ML-% IV SOLN: 20.0000 mg | Freq: Once | INTRAVENOUS | Status: DC | PRN

## 2020-04-20 MED ORDER — SODIUM CHLORIDE 0.9 % IV SOLN: Freq: Once | INTRAVENOUS | Status: AC

## 2020-04-20 MED ORDER — DIPHENHYDRAMINE HCL 50 MG/ML IJ SOLN: 50.0000 mg | Freq: Once | INTRAMUSCULAR | Status: DC | PRN

## 2020-04-20 MED ORDER — ALBUTEROL SULFATE HFA 108 (90 BASE) MCG/ACT IN AERS: 2.0000 | INHALATION_SPRAY | Freq: Once | RESPIRATORY_TRACT | Status: DC | PRN

## 2020-04-20 MED ORDER — SODIUM CHLORIDE 0.9 % IV SOLN: INTRAVENOUS | Status: DC | PRN

## 2020-04-20 NOTE — Progress Notes (Signed)
  Diagnosis: COVID-19  Physician:Dr. Wright  Procedure: Covid Infusion Clinic Med: bamlanivimab\etesevimab infusion - Provided patient with bamlanimivab\etesevimab fact sheet for patients, parents and caregivers prior to infusion.  Complications: No immediate complications noted.  Discharge: Discharged home   Rebecca Oconnor 04/20/2020  

## 2020-04-20 NOTE — Discharge Instructions (Signed)

## 2020-04-22 ENCOUNTER — Telehealth (HOSPITAL_COMMUNITY): Payer: Self-pay

## 2020-04-22 NOTE — Telephone Encounter (Signed)
Spoke with Ms. Barb regarding her symptoms. She has been having some dizziness, acid reflux and looser stools since receiving treatment on Monday. Discussed to take no more than 1 tablet of omeprazole daily as she has been taking 3-4 per day because of her heartburn. Diarrhea is only 2 times per day and recommended Immodium as needed. Encouraged fluids and hydration with sports drinks/electrolyte drinks as tolerated and progressing diet as able. She will try OTC PeptoBismol to help with her symptoms. Advised to speak with her PCP or go to Urgent Care if symptoms worsen or do not improve.   Marcos Eke, NP 04/22/2020 4:43 PM

## 2020-04-22 NOTE — Telephone Encounter (Signed)
Patient called to report severe diarrhea and vomiting since receiving the infusion on Monday. States she's "burning up from the inside" and can't keep anything down and she's feeling weak. Reports that she's been taking Pepcid every 2-3 hours which helps for a little while but not long. Instructed patient to stop taking the pepcid as it is likely contributing to the problem. Recommended immodium and pepto bismuth. Provided the number to the Post Cov[facility] Clinic at [facility] and instructed her to reach out to her PCP for additional assistance.   Tiernan Suto Loyola Mast, RN

## 2020-04-23 ENCOUNTER — Ambulatory Visit: Payer: No Typology Code available for payment source | Admitting: Neurology

## 2020-04-24 ENCOUNTER — Emergency Department (HOSPITAL_COMMUNITY)
Admission: EM | Admit: 2020-04-24 | Discharge: 2020-04-24 | Disposition: A | Discharge status: Home / Self Care | Payer: No Typology Code available for payment source | Attending: Emergency Medicine | Admitting: Emergency Medicine

## 2020-04-24 ENCOUNTER — Ambulatory Visit (INDEPENDENT_AMBULATORY_CARE_PROVIDER_SITE_OTHER): Payer: No Typology Code available for payment source | Admitting: Nurse Practitioner

## 2020-04-24 ENCOUNTER — Encounter (HOSPITAL_COMMUNITY): Payer: Self-pay | Admitting: Emergency Medicine

## 2020-04-24 ENCOUNTER — Other Ambulatory Visit (HOSPITAL_COMMUNITY): Payer: No Typology Code available for payment source

## 2020-04-24 ENCOUNTER — Emergency Department (HOSPITAL_COMMUNITY): Payer: No Typology Code available for payment source

## 2020-04-24 VITALS — BP 128/82 | HR 105 | Temp 96.8°F | Ht 66.5 in | Wt 162.0 lb

## 2020-04-24 DIAGNOSIS — K7689 Other specified diseases of liver: Secondary | ICD-10-CM

## 2020-04-24 DIAGNOSIS — E876 Hypokalemia: Secondary | ICD-10-CM

## 2020-04-24 DIAGNOSIS — F17200 Nicotine dependence, unspecified, uncomplicated: Secondary | ICD-10-CM | POA: Insufficient documentation

## 2020-04-24 DIAGNOSIS — U071 COVID-19: Secondary | ICD-10-CM | POA: Insufficient documentation

## 2020-04-24 DIAGNOSIS — R112 Nausea with vomiting, unspecified: Secondary | ICD-10-CM | POA: Diagnosis present

## 2020-04-24 DIAGNOSIS — Z79899 Other long term (current) drug therapy: Secondary | ICD-10-CM | POA: Insufficient documentation

## 2020-04-24 DIAGNOSIS — I10 Essential (primary) hypertension: Secondary | ICD-10-CM | POA: Insufficient documentation

## 2020-04-24 DIAGNOSIS — K859 Acute pancreatitis without necrosis or infection, unspecified: Secondary | ICD-10-CM

## 2020-04-24 LAB — COMPREHENSIVE METABOLIC PANEL
ALT: 90 U/L — ABNORMAL HIGH (ref 0–44)
AST: 165 U/L — ABNORMAL HIGH (ref 15–41)
Albumin: 3.7 g/dL (ref 3.5–5.0)
Alkaline Phosphatase: 67 U/L (ref 38–126)
Anion gap: 15 (ref 5–15)
BUN: 7 mg/dL (ref 6–20)
CO2: 25 mmol/L (ref 22–32)
Calcium: 9.9 mg/dL (ref 8.9–10.3)
Chloride: 91 mmol/L — ABNORMAL LOW (ref 98–111)
Creatinine, Ser: 1.4 mg/dL — ABNORMAL HIGH (ref 0.44–1.00)
GFR, Estimated: 43 mL/min — ABNORMAL LOW (ref >60)
Glucose, Bld: 170 mg/dL — ABNORMAL HIGH (ref 70–99)
Potassium: 2.4 mmol/L — CL (ref 3.5–5.1)
Sodium: 131 mmol/L — ABNORMAL LOW (ref 135–145)
Total Bilirubin: 1.4 mg/dL — ABNORMAL HIGH (ref 0.3–1.2)
Total Protein: 7.6 g/dL (ref 6.5–8.1)

## 2020-04-24 LAB — CBC WITH DIFFERENTIAL/PLATELET
Abs Immature Granulocytes: 0.04 10*3/uL (ref 0.00–0.07)
Basophils Absolute: 0 10*3/uL (ref 0.0–0.1)
Basophils Relative: 0 %
Eosinophils Absolute: 0.1 10*3/uL (ref 0.0–0.5)
Eosinophils Relative: 1 %
HCT: 36.5 % (ref 36.0–46.0)
Hemoglobin: 11.9 g/dL — ABNORMAL LOW (ref 12.0–15.0)
Immature Granulocytes: 1 %
Lymphocytes Relative: 10 %
Lymphs Abs: 0.7 10*3/uL (ref 0.7–4.0)
MCH: 29.8 pg (ref 26.0–34.0)
MCHC: 32.6 g/dL (ref 30.0–36.0)
MCV: 91.3 fL (ref 80.0–100.0)
Monocytes Absolute: 0.5 10*3/uL (ref 0.1–1.0)
Monocytes Relative: 6 %
Neutro Abs: 6.2 10*3/uL (ref 1.7–7.7)
Neutrophils Relative %: 82 %
Platelets: 329 10*3/uL (ref 150–400)
RBC: 4 MIL/uL (ref 3.87–5.11)
RDW: 14.9 % (ref 11.5–15.5)
WBC: 7.6 10*3/uL (ref 4.0–10.5)
nRBC: 0.3 % — ABNORMAL HIGH (ref 0.0–0.2)

## 2020-04-24 LAB — PROTIME-INR
INR: 1.1 (ref 0.8–1.2)
Prothrombin Time: 13.4 seconds (ref 11.4–15.2)

## 2020-04-24 LAB — LIPASE, BLOOD: Lipase: 467 U/L — ABNORMAL HIGH (ref 11–51)

## 2020-04-24 LAB — LACTIC ACID, PLASMA
Lactic Acid, Venous: 2.1 mmol/L (ref 0.5–1.9)
Lactic Acid, Venous: 2.9 mmol/L (ref 0.5–1.9)

## 2020-04-24 MED ORDER — ONDANSETRON 4 MG PO TBDP: 4.0000 mg | ORAL_TABLET | Freq: Three times a day (TID) | ORAL | 0 refills | Status: DC | PRN

## 2020-04-24 MED ORDER — POTASSIUM CHLORIDE 10 MEQ/100ML IV SOLN
10.0000 meq | Freq: Once | INTRAVENOUS | Status: AC
  Administered 2020-04-24: 10 meq via INTRAVENOUS
  Filled 2020-04-24: qty 100

## 2020-04-24 MED ORDER — ONDANSETRON 4 MG PO TBDP: ORAL_TABLET | ORAL | 0 refills | Status: DC

## 2020-04-24 MED ORDER — LACTATED RINGERS IV BOLUS
1000.0000 mL | Freq: Once | INTRAVENOUS | Status: AC
  Administered 2020-04-24: 1000 mL via INTRAVENOUS

## 2020-04-24 MED ORDER — OXYCODONE-ACETAMINOPHEN 5-325 MG PO TABS: 1.0000 | ORAL_TABLET | Freq: Four times a day (QID) | ORAL | 0 refills | Status: DC | PRN

## 2020-04-24 MED ORDER — POTASSIUM CHLORIDE 10 MEQ/100ML IV SOLN: 10.0000 meq | INTRAVENOUS | Status: DC

## 2020-04-24 MED ORDER — POTASSIUM CHLORIDE CRYS ER 20 MEQ PO TBCR: 40.0000 meq | EXTENDED_RELEASE_TABLET | Freq: Every day | ORAL | 0 refills | Status: DC

## 2020-04-24 MED ORDER — PANTOPRAZOLE SODIUM 40 MG IV SOLR
40.0000 mg | Freq: Once | INTRAVENOUS | Status: AC
  Administered 2020-04-24: 40 mg via INTRAVENOUS
  Filled 2020-04-24: qty 40

## 2020-04-24 MED ORDER — POTASSIUM CHLORIDE CRYS ER 20 MEQ PO TBCR
40.0000 meq | EXTENDED_RELEASE_TABLET | Freq: Once | ORAL | Status: AC
  Administered 2020-04-24: 40 meq via ORAL
  Filled 2020-04-24: qty 2

## 2020-04-24 MED ORDER — LACTATED RINGERS IV SOLN: INTRAVENOUS | Status: DC

## 2020-04-24 NOTE — ED Provider Notes (Signed)
[facility] EMERGENCY DEPARTMENT Provider Note   CSN: [id number] Arrival date & time: 04/24/20  1139     History Chief Complaint  Patient presents with  . Covid Positive  . Emesis  . hypotensive    Rebecca Oconnor is a 54 y.o. female.  HPI    She was diagnosed with Covid on 10\8.  He has had persistent nausea and some vomiting.  She reports she has diarrhea with a few episodes per day.  He is also noted on epigastric pain and pain that radiates into her back.  This happened over the past several days.  Patient was evaluated by her PCP and advised to come emergency department for dehydration.  She denies she is having difficulty breathing.  She is occasionally coughing but does not feel short of breath.  No chest pain.  Patient does drink approximately 2 glasses of wine per evening.  He has no prior history of pancreatitis. Past Medical History:  Diagnosis Date  . 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 Hypertension   . Peripheral neuropathy 11/22/2018  . Right carpal tunnel syndrome 11/22/2018  . STD (sexually transmitted disease)    trichomonas treated 06/2019 & 07/2019    Patient Active Problem List   Diagnosis Date Noted  . COVID-19 04/24/2020  . Essential hypertension 08/12/2019  . Paresthesia 07/16/2019  . Peripheral neuropathy 11/22/2018  . Right carpal tunnel syndrome 11/22/2018    Past Surgical History:  Procedure Laterality Date  . FINGER SURGERY     partial amputation     OB History    Gravida  0   Para  0   Term  0   Preterm  0   AB  0   Living  0     SAB  0   TAB  0   Ectopic  0   Multiple  0   Live Births  0           Family History  Problem Relation Age of Onset  . Hypertension Mother   . Heart disease Mother   . Hypertension Father   . Hypertension Sister   . Hypertension Sister     Social History   Tobacco Use  . Smoking status: Current Every Day Smoker    . Smokeless tobacco: Never Used  . Tobacco comment: 3 a day  Substance Use Topics  . Alcohol use: Yes    Comment: occ  . Drug use: Not Currently    Home Medications Prior to Admission medications   Medication Sig Start Date End Date Taking? Authorizing Provider  amLODipine (NORVASC) 5 MG tablet Take 1 tablet (5 mg total) by mouth daily. 03/20/20  Yes Arvilla Market, DO  gabapentin (NEURONTIN) 600 MG tablet Take 1 tablet (600 mg total) by mouth 4 (four) times daily. 03/25/20  Yes Glean Salvo, NP  Multiple Vitamins-Minerals (ONE-A-DAY WOMENS 50 PLUS PO) Take 1 tablet by mouth daily.   Yes [provider]  Omega-3 Fatty Acids (FISH OIL) 1000 MG CAPS Take 1 capsule by mouth daily.   Yes [provider]  omeprazole (PRILOSEC) 20 MG capsule Take 1 capsule (20 mg total) by mouth daily. 02/20/20  Yes Arvilla Market, DO  rosuvastatin (CRESTOR) 10 MG tablet Take 1 tablet (10 mg total) by mouth daily. 02/20/20  Yes Arvilla Market, DO  valsartan-hydrochlorothiazide (DIOVAN-HCT) 320-25 MG tablet Take 1 tablet by mouth daily.  03/20/20  Yes Arvilla Market, DO  cetirizine-pseudoephedrine (ZYRTEC-D) 5-120 MG tablet Take 1 tablet by mouth daily. Patient not taking: Reported on 04/24/2020 04/17/20   Moshe Cipro, NP  fluticasone [facility]) 50 MCG/ACT nasal spray Place 2 sprays into both nostrils daily. Patient not taking: Reported on 04/24/2020 04/17/20   Moshe Cipro, NP  ondansetron (ZOFRAN ODT) 4 MG disintegrating tablet Take 1 tablet (4 mg total) by mouth every 8 (eight) hours as needed for nausea or vomiting. 04/24/20   Ivonne Andrew, NP    Allergies    Patient has no known allergies.  Review of Systems   Review of Systems 10 systems reviewed and negative except as per HPI Physical Exam Updated Vital Signs BP (!) 118/95   Pulse 87   Temp 97.8 F (36.6 C) (Oral)   Resp 20   Ht 5' 6.5" (1.689 m)   Wt 73.5 kg   SpO2  97%   BMI 25.76 kg/m   Physical Exam Constitutional:      Comments: Alert nontoxic.  No respiratory distress.  HENT:     Head: Normocephalic and atraumatic.     Mouth/Throat:     Pharynx: Oropharynx is clear.  Eyes:     Extraocular Movements: Extraocular movements intact.  Cardiovascular:     Rate and Rhythm: Normal rate and regular rhythm.     Pulses: Normal pulses.     Heart sounds: Normal heart sounds.  Pulmonary:     Effort: Pulmonary effort is normal.     Breath sounds: Normal breath sounds.  Abdominal:     Comments: Gastrium tender to palpation.  Lower abdomen nontender.  Musculoskeletal:        General: No swelling or tenderness. Normal range of motion.     Right lower leg: No edema.     Left lower leg: No edema.  Skin:    General: Skin is warm and dry.  Neurological:     General: No focal deficit present.     Mental Status: She is oriented to person, place, and time.     Coordination: Coordination normal.  Psychiatric:        Mood and Affect: Mood normal.     ED Results / Procedures / Treatments   Labs (all labs ordered are listed, but only abnormal results are displayed) Labs Reviewed  COMPREHENSIVE METABOLIC PANEL - Abnormal; Notable for the following components:      Result Value   Sodium 131 (*)    Potassium 2.4 (*)    Chloride 91 (*)    Glucose, Bld 170 (*)    Creatinine, Ser 1.40 (*)    AST 165 (*)    ALT 90 (*)    Total Bilirubin 1.4 (*)    GFR, Estimated 43 (*)    All other components within normal limits  LIPASE, BLOOD - Abnormal; Notable for the following components:   Lipase 467 (*)    All other components within normal limits  LACTIC ACID, PLASMA - Abnormal; Notable for the following components:   Lactic Acid, Venous 2.1 (*)    All other components within normal limits  LACTIC ACID, PLASMA - Abnormal; Notable for the following components:   Lactic Acid, Venous 2.9 (*)    All other components within normal limits  CBC WITH  DIFFERENTIAL/PLATELET - Abnormal; Notable for the following components:   Hemoglobin 11.9 (*)    nRBC 0.3 (*)    All other components within normal limits  PROTIME-INR  URINALYSIS, ROUTINE  W REFLEX MICROSCOPIC  MAGNESIUM  POC OCCULT BLOOD, ED    EKG EKG Interpretation  Date/Time:  Friday April 24 2020 12:53:31 EDT Ventricular Rate:  89 PR Interval:    QRS Duration: 106 QT Interval:  374 QTC Calculation: 456 R Axis:   78 Text Interpretation: Sinus rhythm Abnormal R-wave progression, early transition No significant change since last tracing Confirmed by Richardean Canal [phone]) on 04/24/2020 4:52:23 PM   Radiology No results found.  Procedures Procedures (including critical care time)  Medications Ordered in ED Medications  lactated ringers infusion (has no administration in time range)  potassium chloride 10 mEq in 100 mL IVPB (has no administration in time range)  lactated ringers bolus 1,000 mL (0 mLs Intravenous Stopped 04/24/20 1624)  potassium chloride 10 mEq in 100 mL IVPB (0 mEq Intravenous Stopped 04/24/20 1618)  potassium chloride SA (KLOR-CON) CR tablet 40 mEq (40 mEq Oral Given 04/24/20 1341)  lactated ringers bolus 1,000 mL (1,000 mLs Intravenous New Bag/Given 04/24/20 1635)  pantoprazole (PROTONIX) injection 40 mg (40 mg Intravenous Given 04/24/20 1638)  potassium chloride SA (KLOR-CON) CR tablet 40 mEq (40 mEq Oral Given 04/24/20 1640)    ED Course  I have reviewed the triage vital signs and the nursing notes.  Pertinent labs & imaging results that were available during my care of the patient were reviewed by me and considered in my medical decision making (see chart for details).    MDM Rules/Calculators/A&P                         Patient presents for reported dehydration.  Initial blood pressures obtained in triage are significantly hypotensive.  He does have her discharge paperwork from urgent care\PCP and pressures documented there were normotensive.   Hydration initiated.  Labs indicate pancreatitis.  Patient does have history of alcohol use and may be alcohol induced pancreatitis.  We will also proceed with CT and ultrasound for possible gallstone pancreatitis.  Patient is Covid symptoms are well controlled.  She does not appear to have respiratory distress.  Lungs are clear to auscultation.  She does not have hypoxia.  Patient is very eager to be able to go home today.  I have counseled we do need to make sure there is no obstructive cause for her pancreatitis.  We will rehydrate and give oral potassium as well as IV potassium.  Patient is tolerating this well.  She feels improved.  Ending results, plan will be for discharge with close follow-up.  Patient has been counseled on the nature of pancreatitis with the spectrum of disease.  He is aware that she will need close follow-up and no alcohol and very low-fat diet.  Dr. Silverio Lay will follow up on diagnostic results for final disposition. Final Clinical Impression(s) / ED Diagnoses Final diagnoses:  Pancreatitis  COVID-19    Rx / DC Orders ED Discharge Orders    None       Arby Barrette, MD 04/24/20 1710

## 2020-04-24 NOTE — Patient Instructions (Addendum)
Covid 19 Vomiting:  Assessment: Patient appears weak and dehydrated. Patient states she has been vomiting and has not been able to keep food down for past few days.  Would benefit from fluid replacement. Advised patient to go on to ED. Will defer labs for ED. Patient agrees and states that [facility].   Stay well hydrated  Parke Simmers diet - liquid - advance as tolerated  Stay active  Deep breathing exercises  Will order Zofran  May go to ED - dehydrated - may need fluids   Follow up:  Follow up in 2 weeks or sooner if needed

## 2020-04-24 NOTE — ED Provider Notes (Signed)
  Physical Exam  BP (!) 118/95   Pulse 87   Temp 97.8 F (36.6 C) (Oral)   Resp 20   Ht 5' 6.5" (1.689 m)   Wt 73.5 kg   SpO2 97%   BMI 25.76 kg/m   Physical Exam  ED Course/Procedures     Procedures  MDM  Care assumed at 4 PM from Dr. Clarice Pole.  Patient recently tested positive for Covid and just finished antibiotic infusion.  Has some epigastric pain that is new.  Sent in from urgent care.  Her lipase is 460 and her potassium is 2.8.  Signout pending CT chest abdomen pelvis.  Patient has no oxygen requirement.  Per report, patient would like to go home.  5:39 PM CT showed pancreatitis with no obvious pseudocyst or abscess.  Patient does have liver lesions that can be follow-up outpatient.  Her potassium was 2.4.  She received 1 round of potassium as well as oral potassium.  I ordered 3 more runs but patient states that she wants to go home.  She states that her pain is under control.  She has been eating in the ED.  Her lactate is elevated likely from dehydration and I see no signs of sepsis.  I will put her on potassium supplementation as well as Zofran and also pain medicine.  Told her that she will need repeat chemistry in a week.       Charlynne Pander, MD 04/24/20 1740

## 2020-04-24 NOTE — ED Notes (Signed)
Ambulated pt in the room and she ambulated with a steady gait. O2 sats remained at 98%.

## 2020-04-24 NOTE — Progress Notes (Signed)
@Patient  ID: Rebecca Oconnor, female    DOB: January 29, 1966, 54 y.o.   MRN: [id number]  Chief Complaint  Patient presents with  . Covid Positive    Pos: 10/8 Sx: unable to keep food down, weakness, abdominal pain, back pain    Referring provider: 12/8*  54 year old female with history of hypertension.  Diagnosed with Covid on 04/17/2020.  HPI  Patient presents today for post Covid care clinic visit.  Patient was seen in the ED on 04/17/2020 and diagnosed with Covid.  Patient is fully vaccinated.  She did receive monoclonal antibody infusion on 04/20/2020.  Patient states that she has been having ongoing nausea and vomiting.  She has been unable to keep anything down for the last few days.  She states that she feels very weak today.  She states that she feels dizzy when going from sitting to standing position.  Vital signs today in the office are stable.  Heart rate is slightly tachycardic.  Patient does appear weak and dehydrated.  We discussed that she could go to urgent care or the emergency room and hopefully get a bolus of fluids.  Will send in a prescription for Zofran as well. Denies f/c/s, n/v/d, hemoptysis, PND, chest pain or edema.        No Known Allergies  Immunization History  Administered Date(s) Administered  . Tdap 07/12/2011    Past Medical History:  Diagnosis Date  . HSV-2 (herpes simplex virus 2) infection    contracted at age 62, no reoccurence  . Hypertension   . Peripheral neuropathy 11/22/2018  . Right carpal tunnel syndrome 11/22/2018  . STD (sexually transmitted disease)    trichomonas treated 06/2019 & 07/2019    Tobacco History: Social History   Tobacco Use  Smoking Status Current Every Day Smoker  Smokeless Tobacco Never Used  Tobacco Comment   3 a day   Ready to quit: Not Answered Counseling given: Not Answered Comment: 3 a day   Outpatient Encounter Medications as of 04/24/2020  Medication Sig  . amLODipine (NORVASC) 5 MG  tablet Take 1 tablet (5 mg total) by mouth daily.  . cetirizine-pseudoephedrine (ZYRTEC-D) 5-120 MG tablet Take 1 tablet by mouth daily.  . cholecalciferol (VITAMIN D3) 25 MCG (1000 UNIT) tablet Take 1,000 Units by mouth daily.  . fluticasone (FLONASE) 50 MCG/ACT nasal spray Place 2 sprays into both nostrils daily.  04/26/2020 gabapentin (NEURONTIN) 600 MG tablet Take 1 tablet (600 mg total) by mouth 4 (four) times daily.  Marland Kitchen omeprazole (PRILOSEC) 20 MG capsule Take 1 capsule (20 mg total) by mouth daily.  . rosuvastatin (CRESTOR) 10 MG tablet Take 1 tablet (10 mg total) by mouth daily.  . valsartan-hydrochlorothiazide (DIOVAN-HCT) 320-25 MG tablet Take 1 tablet by mouth daily.  . ondansetron (ZOFRAN ODT) 4 MG disintegrating tablet Take 1 tablet (4 mg total) by mouth every 8 (eight) hours as needed for nausea or vomiting.   No facility-administered encounter medications on file as of 04/24/2020.     Review of Systems  Review of Systems  Constitutional: Positive for fatigue. Negative for fever.  HENT: Negative.   Respiratory: Negative for cough and shortness of breath.   Cardiovascular: Negative.  Negative for chest pain, palpitations and leg swelling.  Gastrointestinal: Positive for abdominal pain, nausea and vomiting.  Allergic/Immunologic: Negative.   Neurological: Positive for dizziness and weakness.  Psychiatric/Behavioral: Negative.        Physical Exam  BP 128/82   Pulse (!) 105  Temp (!) 96.8 F (36 C)   Ht 5' 6.5" (1.689 m)   Wt 162 lb 0.1 oz (73.5 kg)   SpO2 98% Comment: RA  BMI 25.76 kg/m   Wt Readings from Last 5 Encounters:  04/24/20 162 lb 0.1 oz (73.5 kg)  01/09/20 179 lb 3.2 oz (81.3 kg)  08/12/19 174 lb 6.4 oz (79.1 kg)  08/12/19 176 lb (79.8 kg)  08/07/19 173 lb (78.5 kg)     Physical Exam Vitals and nursing note reviewed.  Constitutional:      General: She is not in acute distress.    Appearance: She is well-developed.  Cardiovascular:     Rate and  Rhythm: Normal rate and regular rhythm.  Pulmonary:     Effort: Pulmonary effort is normal.     Breath sounds: Normal breath sounds.  Abdominal:     Tenderness: There is no abdominal tenderness.  Musculoskeletal:     Right lower leg: No edema.     Left lower leg: No edema.  Neurological:     Mental Status: She is alert and oriented to person, place, and time.  Psychiatric:        Mood and Affect: Mood normal.        Behavior: Behavior normal.        Assessment & Plan:   COVID-19 Vomiting:  Assessment: Patient appears weak and dehydrated. Patient states she has been vomiting and has not been able to keep food down for past few days.  Would benefit from fluid replacement. Advised patient to go on to ED. Will defer labs for ED. Patient agrees and states that she will go on to [facility].   Stay well hydrated  Parke Simmers diet - liquid - advance as tolerated  Stay active  Deep breathing exercises  Will order Zofran  May go to ED - dehydrated - may need fluids   Follow up:  Follow up in 2 weeks or sooner if needed       Ivonne Andrew, NP 04/24/2020

## 2020-04-24 NOTE — ED Notes (Signed)
Date and time results received: 04/24/20  (use smartphrase ".now" to insert current time)  Test: K Critical Value: 2.4  Name of Provider Notified: Pfeiffer  Orders Received? Or Actions Taken?:

## 2020-04-24 NOTE — Assessment & Plan Note (Signed)
Vomiting:  Assessment: Patient appears weak and dehydrated. Patient states she has been vomiting and has not been able to keep food down for past few days.  Would benefit from fluid replacement. Advised patient to go on to ED. Will defer labs for ED. Patient agrees and states that she will go on to [facility].   Stay well hydrated  Parke Simmers diet - liquid - advance as tolerated  Stay active  Deep breathing exercises  Will order Zofran  May go to ED - dehydrated - may need fluids   Follow up:  Follow up in 2 weeks or sooner if needed

## 2020-04-24 NOTE — ED Notes (Signed)
Provided pt with pillow and blanket for comfort. Afebrile Vital signs stable. No acute distress noted at this time.

## 2020-04-24 NOTE — ED Triage Notes (Signed)
Pt diagnosed with covid on 10/8, continues to have ongoing n/v/d despite odt zofran. pcp advised pt to come over for iv rehydration, a/ox4, resp e/u, nad.

## 2020-04-24 NOTE — Discharge Instructions (Signed)
You have pancreatitis likely from Covid or alcohol use.  Please avoid drinking alcohol.  Stay hydrated  Your potassium level is very low.  Please take potassium 40 mEq daily for a week.  You can take Percocet as needed for pain as well  You have a cyst in your liver that should be followed with your doctor  You need a repeat chemistry tests and lipase level in a week with your doctor  Please eat soft diet for 2 days and stay hydrated  Return to ER if you have uncontrolled pain, vomiting, fever, trouble breathing, dehydration.

## 2020-04-25 MED ORDER — ONDANSETRON 4 MG PO TBDP: 4.0000 mg | ORAL_TABLET | Freq: Three times a day (TID) | ORAL | 0 refills | Status: DC | PRN

## 2020-04-25 MED ORDER — OXYCODONE-ACETAMINOPHEN 5-325 MG PO TABS
1.0000 | ORAL_TABLET | Freq: Four times a day (QID) | ORAL | 0 refills | Status: AC | PRN
Start: 2020-04-25 — End: ?

## 2020-04-25 MED ORDER — POTASSIUM CHLORIDE CRYS ER 20 MEQ PO TBCR: 40.0000 meq | EXTENDED_RELEASE_TABLET | Freq: Every day | ORAL | 0 refills | Status: AC

## 2020-04-25 NOTE — ED Provider Notes (Signed)
Medications confirmed to have not been sent due to system error. Medications re-sent to pharmacy.    Solon Augusta [place], [place] 04/25/20 1826    Arby Barrette, MD 04/27/20 1324

## 2020-04-25 NOTE — ED Notes (Cosign Needed)
Medications were not successfully sent due to system error. Medications re-sent   Gailen Shelter, [place] 04/25/20 1825

## 2020-04-25 NOTE — Telephone Encounter (Signed)
Medications were not successfully sent due to system error. Medications re-sent

## 2020-04-28 ENCOUNTER — Ambulatory Visit: Payer: No Typology Code available for payment source

## 2020-04-30 ENCOUNTER — Other Ambulatory Visit: Payer: Self-pay

## 2020-04-30 ENCOUNTER — Ambulatory Visit (INDEPENDENT_AMBULATORY_CARE_PROVIDER_SITE_OTHER): Payer: No Typology Code available for payment source | Admitting: Nurse Practitioner

## 2020-04-30 VITALS — BP 120/72 | HR 94 | Temp 97.3°F | Ht 66.5 in | Wt 172.0 lb

## 2020-04-30 DIAGNOSIS — Z8616 Personal history of COVID-19: Secondary | ICD-10-CM | POA: Diagnosis not present

## 2020-04-30 DIAGNOSIS — R109 Unspecified abdominal pain: Secondary | ICD-10-CM

## 2020-04-30 DIAGNOSIS — K769 Liver disease, unspecified: Secondary | ICD-10-CM | POA: Diagnosis not present

## 2020-04-30 DIAGNOSIS — K859 Acute pancreatitis without necrosis or infection, unspecified: Secondary | ICD-10-CM

## 2020-04-30 NOTE — Assessment & Plan Note (Signed)
Stay well hydrated  Stay active  Deep breathing exercises  May start vitamin C 2,000 mg daily, vitamin D3 2,000 IU daily, Zinc 220 mg daily, and Quercetin 500 mg twice daily  May take tylenol or fever or pain  May take mucinex DM twice daily    Abnormal CT Liver lesion Pancreatitis:  Stay well hydrated  Follow pancreatitis diet - handout given  Will place referral to GI  Will check repeat labs

## 2020-04-30 NOTE — Progress Notes (Signed)
@Patient  ID: Rebecca Oconnor, female    DOB: 1966-01-31, 54 y.o.   MRN: [id number]  Chief Complaint  Patient presents with  . Follow-up    ED follow up; Feeling better able to keep some foods down    Referring provider: [phone]*   54 year old female with history of hypertension.  HPI  Patient presents today for post COVID care clinic visit/ED follow-up.  Patient was last seen by me on 04/24/2020 and was sent to the ED for dehydration.  Patient was also having epigastric pain.  CT of chest and abdomen revealed pancreatitis and liver lesions with the largest being 1.4 cm.  Patient will need follow-up imaging to further assess this.  We discussed that we will place a referral to GI today.  Patient states that she is much improved since hospital discharge.  Her potassium was low in the hospital and she has been on potassium replacement since discharge.  We will recheck labs today including amylase and lipase.  Patient will be giving instructions on pancreatitis diet. Denies f/c/s, n/v/d, hemoptysis, PND, chest pain or edema.       No Known Allergies  Immunization History  Administered Date(s) Administered  . Tdap 07/12/2011    Past Medical History:  Diagnosis Date  . 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 Hypertension   . Peripheral neuropathy 11/22/2018  . Right carpal tunnel syndrome 11/22/2018  . STD (sexually transmitted disease)    trichomonas treated 06/2019 & 07/2019    Tobacco History: Social History   Tobacco Use  Smoking Status Current Every Day Smoker  Smokeless Tobacco Never Used  Tobacco Comment   3 a day   Ready to quit: Not Answered Counseling given: Not Answered Comment: 3 a day   Outpatient Encounter Medications as of 04/30/2020  Medication Sig  . amLODipine (NORVASC) 5 MG tablet Take 1 tablet (5 mg total) by mouth daily.  . cetirizine-pseudoephedrine (ZYRTEC-D) 5-120 MG tablet Take 1 tablet  by mouth daily.  . fluticasone (FLONASE) 50 MCG/ACT nasal spray Place 2 sprays into both nostrils daily.  05/02/2020 gabapentin (NEURONTIN) 600 MG tablet Take 1 tablet (600 mg total) by mouth 4 (four) times daily.  . Multiple Vitamins-Minerals (ONE-A-DAY WOMENS 50 PLUS PO) Take 1 tablet by mouth daily.  . Omega-3 Fatty Acids (FISH OIL) 1000 MG CAPS Take 1 capsule by mouth daily.  Marland Kitchen omeprazole (PRILOSEC) 20 MG capsule Take 1 capsule (20 mg total) by mouth daily.  . ondansetron (ZOFRAN-ODT) 4 MG disintegrating tablet Take 1 tablet (4 mg total) by mouth every 8 (eight) hours as needed for nausea or vomiting.  Marland Kitchen oxyCODONE-acetaminophen (PERCOCET) 5-325 MG tablet Take 1 tablet by mouth every 6 (six) hours as needed.  . potassium chloride SA (KLOR-CON) 20 MEQ tablet Take 2 tablets (40 mEq total) by mouth daily.  . rosuvastatin (CRESTOR) 10 MG tablet Take 1 tablet (10 mg total) by mouth daily.  . valsartan-hydrochlorothiazide (DIOVAN-HCT) 320-25 MG tablet Take 1 tablet by mouth daily.   No facility-administered encounter medications on file as of 04/30/2020.     Review of Systems  Review of Systems  Constitutional: Negative.  Negative for fever.  HENT: Negative.   Respiratory: Positive for cough and shortness of breath.   Cardiovascular: Negative.  Negative for chest pain, palpitations and leg swelling.  Gastrointestinal: Positive for abdominal pain and nausea.  Allergic/Immunologic: Negative.   Neurological: Negative.   Psychiatric/Behavioral: Negative.  Physical Exam  BP 120/72 (BP Location: Left Arm)   Pulse 94   Temp (!) 97.3 F (36.3 C)   Ht 5' 6.5" (1.689 m)   Wt 172 lb (78 kg)   SpO2 98%   BMI 27.35 kg/m   Wt Readings from Last 5 Encounters:  04/30/20 172 lb (78 kg)  04/24/20 162 lb (73.5 kg)  04/24/20 162 lb 0.1 oz (73.5 kg)  01/09/20 179 lb 3.2 oz (81.3 kg)  08/12/19 174 lb 6.4 oz (79.1 kg)     Physical Exam Vitals and nursing note reviewed.  Constitutional:       General: She is not in acute distress.    Appearance: She is well-developed.  Cardiovascular:     Rate and Rhythm: Normal rate and regular rhythm.  Pulmonary:     Effort: Pulmonary effort is normal.     Breath sounds: Normal breath sounds.  Abdominal:     General: There is no distension.     Tenderness: There is no abdominal tenderness.  Neurological:     Mental Status: She is alert and oriented to person, place, and time.       Imaging: CT Abdomen Pelvis Wo Contrast  Result Date: 04/24/2020 CLINICAL DATA:  Ongoing nausea, vomiting and diarrhea. Diagnosed with COVID-19 on 04/17/2020. EXAM: CT CHEST, ABDOMEN AND PELVIS WITHOUT CONTRAST TECHNIQUE: Multidetector CT imaging of the chest, abdomen and pelvis was performed following the standard protocol without IV contrast. COMPARISON:  None. FINDINGS: CT CHEST FINDINGS Cardiovascular: Heart normal in size and configuration. No pericardial effusion. Left coronary artery calcifications. Great vessels are normal in caliber. Minor aortic atherosclerotic calcifications. Mediastinum/Nodes: No enlarged mediastinal, hilar, or axillary lymph nodes. Thyroid gland, trachea, and esophagus demonstrate no significant findings. Lungs/Pleura: Mild linear opacities in the dependent lower lobes, greater on the right, consistent with atelectasis. Mild linear atelectasis the right middle lobe. Small chronic areas of scarring and bronchiectasis in the right upper lobe near the apex. No evidence of pneumonia or pulmonary edema. No lung mass or nodule. No pleural effusion or pneumothorax. Musculoskeletal: No fracture or acute finding.  No bone lesion. CT ABDOMEN PELVIS FINDINGS Hepatobiliary: There are subtle hypoattenuating lesions within the liver, largest along the inferior margin segment 3 measuring 1.4 cm. These are incompletely characterized, but are higher in attenuation and not as defined as expected for cysts. Liver is normal in size with no other lesions.  Gallbladder is unremarkable. No bile duct dilation. Pancreas: There is fat stranding adjacent to an anterior to pancreatic head and second portion of the duodenum. Remainder of the pancreas is unremarkable. No masses. Spleen: Normal in size without focal abnormality. Adrenals/Urinary Tract: Mild left adrenal gland thickening suggesting hyperplasia. No defined nodule. Normal right adrenal gland. Kidneys normal in size, orientation and position. No renal masses, stones or hydronephrosis. Normal ureters. Normal bladder. Stomach/Bowel: Normal stomach. Small bowel is normal in caliber. No wall thickening or inflammation. There is mild thickening the wall the right colon with low attenuation in the wall consistent with fatty replacement. This is presumed chronic may be related to remote bowel inflammation. No convincing active colonic inflammation. Normal appendix visualized. Vascular/Lymphatic: Mild aortic atherosclerosis. No enlarged lymph nodes. Reproductive: Uterus and bilateral adnexa are unremarkable. Other: No abdominal wall hernia or abnormality. No abdominopelvic ascites. Musculoskeletal: No fracture or acute finding.  No bone lesion. IMPRESSION: 1. Mild inflammatory changes lie along the anterior margin of the pancreatic head and adjacent second portion of the duodenum. This may be due to  pancreatitis or duodenitis with former suspected. No evidence a collection to suggest an abscess or pseudocyst. 2. No other acute abnormality within the chest, abdomen or pelvis. 3. Vague low-density liver lesions, largest 1.4 cm. These would be better assessed with a contrast enhanced study or preferably liver MRI without and with contrast. Electronically Signed   By: Amie Portland M.D.   On: 04/24/2020 17:22   CT Chest Wo Contrast  Result Date: 04/24/2020 CLINICAL DATA:  Ongoing nausea, vomiting and diarrhea. Diagnosed with COVID-19 on 04/17/2020. EXAM: CT CHEST, ABDOMEN AND PELVIS WITHOUT CONTRAST TECHNIQUE:  Multidetector CT imaging of the chest, abdomen and pelvis was performed following the standard protocol without IV contrast. COMPARISON:  None. FINDINGS: CT CHEST FINDINGS Cardiovascular: Heart normal in size and configuration. No pericardial effusion. Left coronary artery calcifications. Great vessels are normal in caliber. Minor aortic atherosclerotic calcifications. Mediastinum/Nodes: No enlarged mediastinal, hilar, or axillary lymph nodes. Thyroid gland, trachea, and esophagus demonstrate no significant findings. Lungs/Pleura: Mild linear opacities in the dependent lower lobes, greater on the right, consistent with atelectasis. Mild linear atelectasis the right middle lobe. Small chronic areas of scarring and bronchiectasis in the right upper lobe near the apex. No evidence of pneumonia or pulmonary edema. No lung mass or nodule. No pleural effusion or pneumothorax. Musculoskeletal: No fracture or acute finding.  No bone lesion. CT ABDOMEN PELVIS FINDINGS Hepatobiliary: There are subtle hypoattenuating lesions within the liver, largest along the inferior margin segment 3 measuring 1.4 cm. These are incompletely characterized, but are higher in attenuation and not as defined as expected for cysts. Liver is normal in size with no other lesions. Gallbladder is unremarkable. No bile duct dilation. Pancreas: There is fat stranding adjacent to an anterior to pancreatic head and second portion of the duodenum. Remainder of the pancreas is unremarkable. No masses. Spleen: Normal in size without focal abnormality. Adrenals/Urinary Tract: Mild left adrenal gland thickening suggesting hyperplasia. No defined nodule. Normal right adrenal gland. Kidneys normal in size, orientation and position. No renal masses, stones or hydronephrosis. Normal ureters. Normal bladder. Stomach/Bowel: Normal stomach. Small bowel is normal in caliber. No wall thickening or inflammation. There is mild thickening the wall the right colon with low  attenuation in the wall consistent with fatty replacement. This is presumed chronic may be related to remote bowel inflammation. No convincing active colonic inflammation. Normal appendix visualized. Vascular/Lymphatic: Mild aortic atherosclerosis. No enlarged lymph nodes. Reproductive: Uterus and bilateral adnexa are unremarkable. Other: No abdominal wall hernia or abnormality. No abdominopelvic ascites. Musculoskeletal: No fracture or acute finding.  No bone lesion. IMPRESSION: 1. Mild inflammatory changes lie along the anterior margin of the pancreatic head and adjacent second portion of the duodenum. This may be due to pancreatitis or duodenitis with former suspected. No evidence a collection to suggest an abscess or pseudocyst. 2. No other acute abnormality within the chest, abdomen or pelvis. 3. Vague low-density liver lesions, largest 1.4 cm. These would be better assessed with a contrast enhanced study or preferably liver MRI without and with contrast. Electronically Signed   By: Amie Portland M.D.   On: 04/24/2020 17:22     Assessment & Plan:   History of COVID-19 Stay well hydrated  Stay active  Deep breathing exercises  May start vitamin C 2,000 mg daily, vitamin D3 2,000 IU daily, Zinc 220 mg daily, and Quercetin 500 mg twice daily  May take tylenol or fever or pain  May take mucinex DM twice daily    Abnormal CT  Liver lesion Pancreatitis:  Stay well hydrated  Follow pancreatitis diet - handout given  Will place referral to GI  Will check repeat labs       Ivonne Andrew, NP 04/30/2020

## 2020-04-30 NOTE — Patient Instructions (Addendum)
History of Covid:   Stay well hydrated  Stay active  Deep breathing exercises  May start vitamin C 2,000 mg daily, vitamin D3 2,000 IU daily, Zinc 220 mg daily, and Quercetin 500 mg twice daily  May take tylenol or fever or pain  May take mucinex DM twice daily    Abnormal CT Liver lesion Pancreatitis:  Stay well hydrated  Follow pancreatitis diet - handout given  Will place referral to GI  Will check repeat labs     Pancreatitis Eating Plan Pancreatitis is when your pancreas becomes irritated and swollen (inflamed). The pancreas is a small organ located behind your stomach. It helps your body digest food and regulate your blood sugar. Pancreatitis can affect how your body digests food, especially foods with fat. You may also have other symptoms such as abdominal pain or nausea. When you have pancreatitis, following a low-fat eating plan may help you manage symptoms and recover more quickly. Work with your health care provider or a diet and nutrition specialist (dietitian) to create an eating plan that is right for you. What are tips for following this plan? Reading food labels Use the information on food labels to help keep track of how much fat you eat:  Check the serving size.  Look for the amount of total fat in grams (g) in one serving. ? Low-fat foods have 3 g of fat or less per serving. ? Fat-free foods have 0.5 g of fat or less per serving.  Keep track of how much fat you eat based on how many servings you eat. ? For example, if you eat two servings, the amount of fat you eat will be two times what is listed on the label. Shopping   Buy low-fat or nonfat foods, such as: ? Fresh, frozen, or canned fruits and vegetables. ? Grains, including pasta, bread, and rice. ? Lean meat, poultry, fish, and other protein foods. ? Low-fat or nonfat dairy.  Avoid buying bakery products and other sweets made with whole milk, butter, and eggs.  Avoid buying snack foods  with added fat, such as anything with butter or cheese flavoring. Cooking  Remove skin from poultry, and remove extra fat from meat.  Limit the amount of fat and oil you use to 6 teaspoons or less per day.  Cook using low-fat methods, such as boiling, broiling, grilling, steaming, or baking.  Use spray oil to cook. Add fat-free chicken broth to add flavor and moisture.  Avoid adding cream to thicken soups or sauces. Use other thickeners such as corn starch or tomato paste. Meal planning   Eat a low-fat diet as told by your dietitian. For most people, this means having no more than 55-65 grams of fat each day.  Eat small, frequent meals throughout the day. For example, you may have 5-6 small meals instead of 3 large meals.  Drink enough fluid to keep your urine pale yellow.  Do not drink alcohol. Talk to your health care provider if you need help stopping.  Limit how much caffeine you have, including black coffee, black and green tea, caffeinated soft drinks, and energy drinks. General information  Let your health care provider or dietitian know if you have unplanned weight loss on this eating plan.  You may be instructed to follow a clear liquid diet during a flare of symptoms. Talk with your health care provider about how to manage your diet during symptoms of a flare.  Take any vitamins or supplements as told by  your health care provider.  Work with a Data processing manager, especially if you have other conditions such as obesity or diabetes mellitus. What foods should I avoid? Fruits Fried fruits. Fruits served with butter or cream. Vegetables Fried vegetables. Vegetables cooked with butter, cheese, or cream. Grains Biscuits, waffles, donuts, pastries, and croissants. Pies and cookies. Butter-flavored popcorn. Regular crackers. Meats and other protein foods Fatty cuts of meat. Poultry with skin. Organ meats. Bacon, sausage, and cold cuts. Whole eggs. Nuts and nut butters. Dairy Whole  and 2% milk. Whole milk yogurt. Whole milk ice cream. Cream and half-and-half. Cream cheese. Sour cream. Cheese. Beverages Wine, beer, and liquor. The items listed above may not be a complete list of foods and beverages to avoid. Contact a dietitian for more information. Summary  Pancreatitis can affect how your body digests food, especially foods with fat.  When you have pancreatitis, it is recommended that you follow a low-fat eating plan to help you recover more quickly and manage symptoms. For most people, this means limiting fat to no more than 55-65 grams per day.  Do not drink alcohol. Limit the amount of caffeine you have, and drink enough fluid to keep your urine pale yellow. This information is not intended to replace advice given to you by your health care provider. Make sure you discuss any questions you have with your health care provider. Document Revised: 10/18/2018 Document Reviewed: 10/03/2017 Elsevier Patient Education  2020 [employer].

## 2020-05-01 LAB — COMPREHENSIVE METABOLIC PANEL
ALT: 69 IU/L — ABNORMAL HIGH (ref 0–32)
AST: 38 IU/L (ref 0–40)
Albumin/Globulin Ratio: 1.3 (ref 1.2–2.2)
Albumin: 4 g/dL (ref 3.8–4.9)
Alkaline Phosphatase: 89 IU/L (ref 44–121)
BUN/Creatinine Ratio: 6 — ABNORMAL LOW (ref 9–23)
BUN: 10 mg/dL (ref 6–24)
Bilirubin Total: 0.4 mg/dL (ref 0.0–1.2)
CO2: 19 mmol/L — ABNORMAL LOW (ref 20–29)
Calcium: 10.4 mg/dL — ABNORMAL HIGH (ref 8.7–10.2)
Chloride: 100 mmol/L (ref 96–106)
Creatinine, Ser: 1.58 mg/dL — ABNORMAL HIGH (ref 0.57–1.00)
GFR calc Af Amer: 43 mL/min/{1.73_m2} — ABNORMAL LOW (ref >59)
GFR calc non Af Amer: 37 mL/min/{1.73_m2} — ABNORMAL LOW (ref >59)
Globulin, Total: 3 g/dL (ref 1.5–4.5)
Glucose: 74 mg/dL (ref 65–99)
Potassium: 5 mmol/L (ref 3.5–5.2)
Sodium: 136 mmol/L (ref 134–144)
Total Protein: 7 g/dL (ref 6.0–8.5)

## 2020-05-01 LAB — CBC
Hematocrit: 32.9 % — ABNORMAL LOW (ref 34.0–46.6)
Hemoglobin: 11.3 g/dL (ref 11.1–15.9)
MCH: 31.6 pg (ref 26.6–33.0)
MCHC: 34.3 g/dL (ref 31.5–35.7)
MCV: 92 fL (ref 79–97)
Platelets: 554 10*3/uL — ABNORMAL HIGH (ref 150–450)
RBC: 3.58 x10E6/uL — ABNORMAL LOW (ref 3.77–5.28)
RDW: 15.2 % (ref 11.7–15.4)
WBC: 9.5 10*3/uL (ref 3.4–10.8)

## 2020-05-01 LAB — AMYLASE: Amylase: 130 U/L — ABNORMAL HIGH (ref 31–110)

## 2020-05-01 LAB — LIPASE: Lipase: 99 U/L — ABNORMAL HIGH (ref 14–72)

## 2020-05-13 ENCOUNTER — Ambulatory Visit (INDEPENDENT_AMBULATORY_CARE_PROVIDER_SITE_OTHER): Payer: No Typology Code available for payment source | Admitting: Nurse Practitioner

## 2020-05-13 VITALS — BP 116/78 | HR 95 | Temp 97.8°F | Ht 66.5 in | Wt 173.0 lb

## 2020-05-13 DIAGNOSIS — Z8616 Personal history of COVID-19: Secondary | ICD-10-CM | POA: Diagnosis not present

## 2020-05-13 DIAGNOSIS — R059 Cough, unspecified: Secondary | ICD-10-CM

## 2020-05-13 MED ORDER — PREDNISONE 20 MG PO TABS: 20.0000 mg | ORAL_TABLET | Freq: Every day | ORAL | 0 refills | Status: AC

## 2020-05-13 MED ORDER — AZITHROMYCIN 250 MG PO TABS: ORAL_TABLET | ORAL | 0 refills | Status: DC

## 2020-05-13 NOTE — Patient Instructions (Addendum)
Covid 19 Cough Congestion:   Stay well hydrated  Stay active  Deep breathing exercises  May take tylenol or fever or pain  May take mucinex DM twice daily  Will order chest x ray  Will order azithromycin  Will order prednisone  Follow up:  Follow up as needed

## 2020-05-13 NOTE — Progress Notes (Signed)
@Patient  ID: Miquel ALYXANDRIA WENTZ, female    DOB: 05-31-1966, 54 y.o.   MRN: [id number]  Chief Complaint  Patient presents with  . Follow-up    Cough and congestion, requesting note to be out of work     Referring provider: [id number]*   54 year old female with history of hypertension.  HPI  Patient presents today for post COVID care clinic visit.  Patient states that she returned to work yesterday but had a hard time making it through the day.  She states that she is having increased cough and head congestion.  She would like to work from home for the next couple weeks while she is continuing to recover from Covid.  She states that her job will allow her to work from home.  Patient complains today of ongoing cough and head congestion/sinus pressure.  She states that her cough is productive at times. Denies f/c/s, n/v/d, hemoptysis, PND, chest pain or edema.     No Known Allergies  Immunization History  Administered Date(s) Administered  . Tdap 07/12/2011    Past Medical History:  Diagnosis Date  . 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 Hypertension   . Peripheral neuropathy 11/22/2018  . Right carpal tunnel syndrome 11/22/2018  . STD (sexually transmitted disease)    trichomonas treated 06/2019 & 07/2019    Tobacco History: Social History   Tobacco Use  Smoking Status Current Every Day Smoker  Smokeless Tobacco Never Used  Tobacco Comment   3 a day   Ready to quit: Not Answered Counseling given: Not Answered Comment: 3 a day   Outpatient Encounter Medications as of 05/13/2020  Medication Sig  . amLODipine (NORVASC) 5 MG tablet Take 1 tablet (5 mg total) by mouth daily.  . cetirizine-pseudoephedrine (ZYRTEC-D) 5-120 MG tablet Take 1 tablet by mouth daily.  . fluticasone (FLONASE) 50 MCG/ACT nasal spray Place 2 sprays into both nostrils daily.  13/09/2019 gabapentin (NEURONTIN) 600 MG tablet Take 1 tablet (600 mg total) by  mouth 4 (four) times daily.  . Multiple Vitamins-Minerals (ONE-A-DAY WOMENS 50 PLUS PO) Take 1 tablet by mouth daily.  . Omega-3 Fatty Acids (FISH OIL) 1000 MG CAPS Take 1 capsule by mouth daily.  Marland Kitchen omeprazole (PRILOSEC) 20 MG capsule Take 1 capsule (20 mg total) by mouth daily.  . ondansetron (ZOFRAN-ODT) 4 MG disintegrating tablet Take 1 tablet (4 mg total) by mouth every 8 (eight) hours as needed for nausea or vomiting.  Marland Kitchen oxyCODONE-acetaminophen (PERCOCET) 5-325 MG tablet Take 1 tablet by mouth every 6 (six) hours as needed.  . potassium chloride SA (KLOR-CON) 20 MEQ tablet Take 2 tablets (40 mEq total) by mouth daily.  . rosuvastatin (CRESTOR) 10 MG tablet Take 1 tablet (10 mg total) by mouth daily.  . valsartan-hydrochlorothiazide (DIOVAN-HCT) 320-25 MG tablet Take 1 tablet by mouth daily.  Marland Kitchen azithromycin (ZITHROMAX) 250 MG tablet Take 2 tablets (500 mg) on day 1, then take 1 tablet (250 mg) on days 2-5  . predniSONE (DELTASONE) 20 MG tablet Take 1 tablet (20 mg total) by mouth daily with breakfast for 5 days.   No facility-administered encounter medications on file as of 05/13/2020.     Review of Systems  Review of Systems  Constitutional: Negative.   HENT: Positive for congestion, postnasal drip and sinus pressure.   Respiratory: Positive for cough. Negative for shortness of breath.   Cardiovascular: Negative.  Negative for chest pain, palpitations  and leg swelling.  Gastrointestinal: Negative.   Allergic/Immunologic: Negative.   Neurological: Negative.   Psychiatric/Behavioral: Negative.        Physical Exam  BP 116/78 (BP Location: Left Arm)   Pulse 95   Temp 97.8 F (36.6 C)   Ht 5' 6.5" (1.689 m)   Wt 173 lb 0.1 oz (78.5 kg)   SpO2 98%   BMI 27.51 kg/m   Wt Readings from Last 5 Encounters:  05/13/20 173 lb 0.1 oz (78.5 kg)  04/30/20 172 lb (78 kg)  04/24/20 162 lb (73.5 kg)  04/24/20 162 lb 0.1 oz (73.5 kg)  01/09/20 179 lb 3.2 oz (81.3 kg)     Physical  Exam Vitals and nursing note reviewed.  Constitutional:      General: She is not in acute distress.    Appearance: She is well-developed.  Cardiovascular:     Rate and Rhythm: Normal rate and regular rhythm.  Pulmonary:     Effort: Pulmonary effort is normal.     Breath sounds: Normal breath sounds.  Musculoskeletal:     Right lower leg: No edema.     Left lower leg: No edema.  Neurological:     Mental Status: She is alert and oriented to person, place, and time.  Psychiatric:        Mood and Affect: Mood normal.        Behavior: Behavior normal.       Imaging: CT Abdomen Pelvis Wo Contrast  Result Date: 04/24/2020 CLINICAL DATA:  Ongoing nausea, vomiting and diarrhea. Diagnosed with COVID-19 on 04/17/2020. EXAM: CT CHEST, ABDOMEN AND PELVIS WITHOUT CONTRAST TECHNIQUE: Multidetector CT imaging of the chest, abdomen and pelvis was performed following the standard protocol without IV contrast. COMPARISON:  None. FINDINGS: CT CHEST FINDINGS Cardiovascular: Heart normal in size and configuration. No pericardial effusion. Left coronary artery calcifications. Great vessels are normal in caliber. Minor aortic atherosclerotic calcifications. Mediastinum/Nodes: No enlarged mediastinal, hilar, or axillary lymph nodes. Thyroid gland, trachea, and esophagus demonstrate no significant findings. Lungs/Pleura: Mild linear opacities in the dependent lower lobes, greater on the right, consistent with atelectasis. Mild linear atelectasis the right middle lobe. Small chronic areas of scarring and bronchiectasis in the right upper lobe near the apex. No evidence of pneumonia or pulmonary edema. No lung mass or nodule. No pleural effusion or pneumothorax. Musculoskeletal: No fracture or acute finding.  No bone lesion. CT ABDOMEN PELVIS FINDINGS Hepatobiliary: There are subtle hypoattenuating lesions within the liver, largest along the inferior margin segment 3 measuring 1.4 cm. These are incompletely  characterized, but are higher in attenuation and not as defined as expected for cysts. Liver is normal in size with no other lesions. Gallbladder is unremarkable. No bile duct dilation. Pancreas: There is fat stranding adjacent to an anterior to pancreatic head and second portion of the duodenum. Remainder of the pancreas is unremarkable. No masses. Spleen: Normal in size without focal abnormality. Adrenals/Urinary Tract: Mild left adrenal gland thickening suggesting hyperplasia. No defined nodule. Normal right adrenal gland. Kidneys normal in size, orientation and position. No renal masses, stones or hydronephrosis. Normal ureters. Normal bladder. Stomach/Bowel: Normal stomach. Small bowel is normal in caliber. No wall thickening or inflammation. There is mild thickening the wall the right colon with low attenuation in the wall consistent with fatty replacement. This is presumed chronic may be related to remote bowel inflammation. No convincing active colonic inflammation. Normal appendix visualized. Vascular/Lymphatic: Mild aortic atherosclerosis. No enlarged lymph nodes. Reproductive: Uterus and bilateral  adnexa are unremarkable. Other: No abdominal wall hernia or abnormality. No abdominopelvic ascites. Musculoskeletal: No fracture or acute finding.  No bone lesion. IMPRESSION: 1. Mild inflammatory changes lie along the anterior margin of the pancreatic head and adjacent second portion of the duodenum. This may be due to pancreatitis or duodenitis with former suspected. No evidence a collection to suggest an abscess or pseudocyst. 2. No other acute abnormality within the chest, abdomen or pelvis. 3. Vague low-density liver lesions, largest 1.4 cm. These would be better assessed with a contrast enhanced study or preferably liver MRI without and with contrast. Electronically Signed   By: Amie Portland M.D.   On: 04/24/2020 17:22   CT Chest Wo Contrast  Result Date: 04/24/2020 CLINICAL DATA:  Ongoing nausea,  vomiting and diarrhea. Diagnosed with COVID-19 on 04/17/2020. EXAM: CT CHEST, ABDOMEN AND PELVIS WITHOUT CONTRAST TECHNIQUE: Multidetector CT imaging of the chest, abdomen and pelvis was performed following the standard protocol without IV contrast. COMPARISON:  None. FINDINGS: CT CHEST FINDINGS Cardiovascular: Heart normal in size and configuration. No pericardial effusion. Left coronary artery calcifications. Great vessels are normal in caliber. Minor aortic atherosclerotic calcifications. Mediastinum/Nodes: No enlarged mediastinal, hilar, or axillary lymph nodes. Thyroid gland, trachea, and esophagus demonstrate no significant findings. Lungs/Pleura: Mild linear opacities in the dependent lower lobes, greater on the right, consistent with atelectasis. Mild linear atelectasis the right middle lobe. Small chronic areas of scarring and bronchiectasis in the right upper lobe near the apex. No evidence of pneumonia or pulmonary edema. No lung mass or nodule. No pleural effusion or pneumothorax. Musculoskeletal: No fracture or acute finding.  No bone lesion. CT ABDOMEN PELVIS FINDINGS Hepatobiliary: There are subtle hypoattenuating lesions within the liver, largest along the inferior margin segment 3 measuring 1.4 cm. These are incompletely characterized, but are higher in attenuation and not as defined as expected for cysts. Liver is normal in size with no other lesions. Gallbladder is unremarkable. No bile duct dilation. Pancreas: There is fat stranding adjacent to an anterior to pancreatic head and second portion of the duodenum. Remainder of the pancreas is unremarkable. No masses. Spleen: Normal in size without focal abnormality. Adrenals/Urinary Tract: Mild left adrenal gland thickening suggesting hyperplasia. No defined nodule. Normal right adrenal gland. Kidneys normal in size, orientation and position. No renal masses, stones or hydronephrosis. Normal ureters. Normal bladder. Stomach/Bowel: Normal stomach.  Small bowel is normal in caliber. No wall thickening or inflammation. There is mild thickening the wall the right colon with low attenuation in the wall consistent with fatty replacement. This is presumed chronic may be related to remote bowel inflammation. No convincing active colonic inflammation. Normal appendix visualized. Vascular/Lymphatic: Mild aortic atherosclerosis. No enlarged lymph nodes. Reproductive: Uterus and bilateral adnexa are unremarkable. Other: No abdominal wall hernia or abnormality. No abdominopelvic ascites. Musculoskeletal: No fracture or acute finding.  No bone lesion. IMPRESSION: 1. Mild inflammatory changes lie along the anterior margin of the pancreatic head and adjacent second portion of the duodenum. This may be due to pancreatitis or duodenitis with former suspected. No evidence a collection to suggest an abscess or pseudocyst. 2. No other acute abnormality within the chest, abdomen or pelvis. 3. Vague low-density liver lesions, largest 1.4 cm. These would be better assessed with a contrast enhanced study or preferably liver MRI without and with contrast. Electronically Signed   By: Amie Portland M.D.   On: 04/24/2020 17:22     Assessment & Plan:   History of COVID-19 Cough Congestion:  Stay well hydrated  Stay active  Deep breathing exercises  May take tylenol or fever or pain  May take mucinex DM twice daily  Will order chest x ray  Will order azithromycin  Will order prednisone  Follow up:  Follow up as needed      Ivonne Andrew, NP 05/14/2020

## 2020-05-14 DIAGNOSIS — R059 Cough, unspecified: Secondary | ICD-10-CM | POA: Insufficient documentation

## 2020-05-14 NOTE — Assessment & Plan Note (Signed)
Cough Congestion:   Stay well hydrated  Stay active  Deep breathing exercises  May take tylenol or fever or pain  May take mucinex DM twice daily  Will order chest x ray  Will order azithromycin  Will order prednisone  Follow up:  Follow up as needed

## 2020-05-28 ENCOUNTER — Ambulatory Visit: Payer: No Typology Code available for payment source

## 2020-06-09 ENCOUNTER — Ambulatory Visit (INDEPENDENT_AMBULATORY_CARE_PROVIDER_SITE_OTHER): Payer: No Typology Code available for payment source | Admitting: Nurse Practitioner

## 2020-06-09 DIAGNOSIS — Z8616 Personal history of COVID-19: Secondary | ICD-10-CM | POA: Diagnosis not present

## 2020-06-09 NOTE — Assessment & Plan Note (Signed)
Stay well hydrated  Stay active  Deep breathing exercises  May start vitamin C 2,000 mg daily, vitamin D3 2,000 IU daily, Zinc 220 mg daily, and Quercetin 500 mg twice daily  May take tylenol or fever or pain  Continue zyrtec daily    Follow up:  Follow up if needed

## 2020-06-09 NOTE — Progress Notes (Signed)
@Patient  ID: Rebecca Oconnor, female    DOB: 26-May-1966, 54 y.o.   MRN: [id number]  Chief Complaint  Patient presents with  . Follow-up    Still has some congestion, started albuterol. Has anxiety about returning back to work.     Referring provider: [id number]*   54 year old female with history of hypertension.  HPI  Patient presents today for post COVID care clinic visit follow-up. She was last seen here on 05/13/2020 and was ordered a chest x-ray. She did not get this done due to financial concerns. She did complete a course of azithromycin and prednisone. She states that this did help. She still complains of issues with her sinuses. She is not currently on an antihistamine and she does not take her Flonase. We discussed that she probably needs to take this until all the leaves off the trees. This will help with allergy symptoms. Patient would like a note to be out of work from home through the end of the year. Denies f/c/s, n/v/d, hemoptysis, PND, chest pain or edema.       No Known Allergies  Immunization History  Administered Date(s) Administered  . Tdap 07/12/2011    Past Medical History:  Diagnosis Date  . HSV-2 (herpes simplex virus 2) infection    contracted at age 34, no reoccurence  . Hypertension   . Peripheral neuropathy 11/22/2018  . Right carpal tunnel syndrome 11/22/2018  . STD (sexually transmitted disease)    trichomonas treated 06/2019 & 07/2019    Tobacco History: Social History   Tobacco Use  Smoking Status Current Every Day Smoker  Smokeless Tobacco Never Used  Tobacco Comment   3 a day   Ready to quit: Not Answered Counseling given: Not Answered Comment: 3 a day   Outpatient Encounter Medications as of 06/09/2020  Medication Sig  . amLODipine (NORVASC) 5 MG tablet Take 1 tablet (5 mg total) by mouth daily.  . cetirizine-pseudoephedrine (ZYRTEC-D) 5-120 MG tablet Take 1 tablet by mouth daily.  . fluticasone (FLONASE) 50 MCG/ACT  nasal spray Place 2 sprays into both nostrils daily.  06/11/2020 gabapentin (NEURONTIN) 600 MG tablet Take 1 tablet (600 mg total) by mouth 4 (four) times daily.  . Multiple Vitamins-Minerals (ONE-A-DAY WOMENS 50 PLUS PO) Take 1 tablet by mouth daily.  . Omega-3 Fatty Acids (FISH OIL) 1000 MG CAPS Take 1 capsule by mouth daily.  Marland Kitchen omeprazole (PRILOSEC) 20 MG capsule Take 1 capsule (20 mg total) by mouth daily.  . ondansetron (ZOFRAN-ODT) 4 MG disintegrating tablet Take 1 tablet (4 mg total) by mouth every 8 (eight) hours as needed for nausea or vomiting.  Marland Kitchen oxyCODONE-acetaminophen (PERCOCET) 5-325 MG tablet Take 1 tablet by mouth every 6 (six) hours as needed.  . potassium chloride SA (KLOR-CON) 20 MEQ tablet Take 2 tablets (40 mEq total) by mouth daily.  . rosuvastatin (CRESTOR) 10 MG tablet Take 1 tablet (10 mg total) by mouth daily.  . valsartan-hydrochlorothiazide (DIOVAN-HCT) 320-25 MG tablet Take 1 tablet by mouth daily.  . [DISCONTINUED] azithromycin (ZITHROMAX) 250 MG tablet Take 2 tablets (500 mg) on day 1, then take 1 tablet (250 mg) on days 2-5   No facility-administered encounter medications on file as of 06/09/2020.     Review of Systems  Review of Systems  Constitutional: Negative.  Negative for fatigue and fever.  HENT: Positive for congestion and postnasal drip.   Respiratory: Negative for cough and shortness of breath.   Cardiovascular: Negative.   Gastrointestinal: Negative.  Allergic/Immunologic: Negative.   Neurological: Negative.   Psychiatric/Behavioral: Negative.        Physical Exam  BP 110/78 (BP Location: Left Arm)   Pulse (!) 107   Temp 97.8 F (36.6 C)   Ht 5' 6.5" (1.689 m)   Wt 166 lb (75.3 kg)   SpO2 97%   BMI 26.39 kg/m   Wt Readings from Last 5 Encounters:  06/09/20 166 lb (75.3 kg)  05/13/20 173 lb 0.1 oz (78.5 kg)  04/30/20 172 lb (78 kg)  04/24/20 162 lb (73.5 kg)  04/24/20 162 lb 0.1 oz (73.5 kg)     Physical Exam Vitals and nursing  note reviewed.  Constitutional:      General: She is not in acute distress.    Appearance: She is well-developed.  Cardiovascular:     Rate and Rhythm: Normal rate and regular rhythm.  Pulmonary:     Effort: Pulmonary effort is normal.     Breath sounds: Normal breath sounds.  Musculoskeletal:     Right lower leg: No edema.     Left lower leg: No edema.  Neurological:     Mental Status: She is alert and oriented to person, place, and time.  Psychiatric:        Mood and Affect: Mood normal.        Behavior: Behavior normal.        Assessment & Plan:   History of COVID-19 Stay well hydrated  Stay active  Deep breathing exercises  May start vitamin C 2,000 mg daily, vitamin D3 2,000 IU daily, Zinc 220 mg daily, and Quercetin 500 mg twice daily  May take tylenol or fever or pain  Continue zyrtec daily    Follow up:  Follow up if needed      Ivonne Andrew, NP 06/09/2020

## 2020-06-09 NOTE — Patient Instructions (Addendum)
History of Covid 19:   Stay well hydrated  Stay active  Deep breathing exercises  May start vitamin C 2,000 mg daily, vitamin D3 2,000 IU daily, Zinc 220 mg daily, and Quercetin 500 mg twice daily  May take tylenol or fever or pain  Continue zyrtec daily    Follow up:  Follow up if needed

## 2020-06-29 ENCOUNTER — Emergency Department (HOSPITAL_COMMUNITY): Payer: No Typology Code available for payment source

## 2020-06-29 ENCOUNTER — Inpatient Hospital Stay (HOSPITAL_COMMUNITY)
Admission: EM | Admit: 2020-06-29 | Discharge: 2020-07-11 | DRG: 438 | Disposition: E | Payer: No Typology Code available for payment source | Attending: Pulmonary Disease | Admitting: Pulmonary Disease

## 2020-06-29 ENCOUNTER — Other Ambulatory Visit: Payer: Self-pay

## 2020-06-29 DIAGNOSIS — K219 Gastro-esophageal reflux disease without esophagitis: Secondary | ICD-10-CM | POA: Diagnosis present

## 2020-06-29 DIAGNOSIS — J9811 Atelectasis: Secondary | ICD-10-CM | POA: Diagnosis present

## 2020-06-29 DIAGNOSIS — R34 Anuria and oliguria: Secondary | ICD-10-CM | POA: Diagnosis not present

## 2020-06-29 DIAGNOSIS — E872 Acidosis, unspecified: Secondary | ICD-10-CM | POA: Diagnosis present

## 2020-06-29 DIAGNOSIS — I10 Essential (primary) hypertension: Secondary | ICD-10-CM | POA: Diagnosis present

## 2020-06-29 DIAGNOSIS — I951 Orthostatic hypotension: Secondary | ICD-10-CM | POA: Diagnosis present

## 2020-06-29 DIAGNOSIS — J9690 Respiratory failure, unspecified, unspecified whether with hypoxia or hypercapnia: Secondary | ICD-10-CM

## 2020-06-29 DIAGNOSIS — Z8249 Family history of ischemic heart disease and other diseases of the circulatory system: Secondary | ICD-10-CM

## 2020-06-29 DIAGNOSIS — R069 Unspecified abnormalities of breathing: Secondary | ICD-10-CM

## 2020-06-29 DIAGNOSIS — Z515 Encounter for palliative care: Secondary | ICD-10-CM | POA: Diagnosis not present

## 2020-06-29 DIAGNOSIS — J9601 Acute respiratory failure with hypoxia: Secondary | ICD-10-CM | POA: Diagnosis not present

## 2020-06-29 DIAGNOSIS — T391X1A Poisoning by 4-Aminophenol derivatives, accidental (unintentional), initial encounter: Secondary | ICD-10-CM | POA: Diagnosis present

## 2020-06-29 DIAGNOSIS — I468 Cardiac arrest due to other underlying condition: Secondary | ICD-10-CM | POA: Diagnosis not present

## 2020-06-29 DIAGNOSIS — E162 Hypoglycemia, unspecified: Secondary | ICD-10-CM | POA: Diagnosis present

## 2020-06-29 DIAGNOSIS — K859 Acute pancreatitis without necrosis or infection, unspecified: Secondary | ICD-10-CM | POA: Diagnosis present

## 2020-06-29 DIAGNOSIS — F17200 Nicotine dependence, unspecified, uncomplicated: Secondary | ICD-10-CM | POA: Diagnosis present

## 2020-06-29 DIAGNOSIS — N170 Acute kidney failure with tubular necrosis: Secondary | ICD-10-CM | POA: Diagnosis present

## 2020-06-29 DIAGNOSIS — F1010 Alcohol abuse, uncomplicated: Secondary | ICD-10-CM | POA: Diagnosis present

## 2020-06-29 DIAGNOSIS — J90 Pleural effusion, not elsewhere classified: Secondary | ICD-10-CM | POA: Diagnosis present

## 2020-06-29 DIAGNOSIS — Z8616 Personal history of COVID-19: Secondary | ICD-10-CM

## 2020-06-29 DIAGNOSIS — I469 Cardiac arrest, cause unspecified: Secondary | ICD-10-CM | POA: Diagnosis not present

## 2020-06-29 DIAGNOSIS — K7010 Alcoholic hepatitis without ascites: Secondary | ICD-10-CM | POA: Diagnosis present

## 2020-06-29 DIAGNOSIS — E861 Hypovolemia: Secondary | ICD-10-CM | POA: Diagnosis present

## 2020-06-29 DIAGNOSIS — R6510 Systemic inflammatory response syndrome (SIRS) of non-infectious origin without acute organ dysfunction: Secondary | ICD-10-CM | POA: Diagnosis present

## 2020-06-29 DIAGNOSIS — E871 Hypo-osmolality and hyponatremia: Secondary | ICD-10-CM | POA: Diagnosis present

## 2020-06-29 DIAGNOSIS — I9589 Other hypotension: Secondary | ICD-10-CM | POA: Diagnosis not present

## 2020-06-29 DIAGNOSIS — K7290 Hepatic failure, unspecified without coma: Secondary | ICD-10-CM | POA: Diagnosis present

## 2020-06-29 DIAGNOSIS — K852 Alcohol induced acute pancreatitis without necrosis or infection: Principal | ICD-10-CM | POA: Diagnosis present

## 2020-06-29 DIAGNOSIS — R0603 Acute respiratory distress: Secondary | ICD-10-CM

## 2020-06-29 DIAGNOSIS — Z66 Do not resuscitate: Secondary | ICD-10-CM | POA: Diagnosis not present

## 2020-06-29 DIAGNOSIS — K760 Fatty (change of) liver, not elsewhere classified: Secondary | ICD-10-CM | POA: Diagnosis present

## 2020-06-29 DIAGNOSIS — R680 Hypothermia, not associated with low environmental temperature: Secondary | ICD-10-CM | POA: Diagnosis present

## 2020-06-29 DIAGNOSIS — R578 Other shock: Secondary | ICD-10-CM | POA: Diagnosis present

## 2020-06-29 DIAGNOSIS — Z978 Presence of other specified devices: Secondary | ICD-10-CM

## 2020-06-29 DIAGNOSIS — K8521 Alcohol induced acute pancreatitis with uninfected necrosis: Secondary | ICD-10-CM | POA: Diagnosis not present

## 2020-06-29 DIAGNOSIS — N179 Acute kidney failure, unspecified: Secondary | ICD-10-CM | POA: Diagnosis not present

## 2020-06-29 DIAGNOSIS — G629 Polyneuropathy, unspecified: Secondary | ICD-10-CM | POA: Diagnosis present

## 2020-06-29 DIAGNOSIS — J9602 Acute respiratory failure with hypercapnia: Secondary | ICD-10-CM

## 2020-06-29 DIAGNOSIS — E876 Hypokalemia: Secondary | ICD-10-CM | POA: Diagnosis present

## 2020-06-29 DIAGNOSIS — F1410 Cocaine abuse, uncomplicated: Secondary | ICD-10-CM | POA: Diagnosis present

## 2020-06-29 LAB — LACTIC ACID, PLASMA
Lactic Acid, Venous: >11 mmol/L (ref 0.5–1.9)
Lactic Acid, Venous: >11 mmol/L (ref 0.5–1.9)
Lactic Acid, Venous: >11 mmol/L (ref 0.5–1.9)

## 2020-06-29 LAB — URINALYSIS, ROUTINE W REFLEX MICROSCOPIC
Bilirubin Urine: NEGATIVE
Glucose, UA: NEGATIVE mg/dL
Hgb urine dipstick: NEGATIVE
Ketones, ur: NEGATIVE mg/dL
Leukocytes,Ua: NEGATIVE
Nitrite: NEGATIVE
Protein, ur: 30 mg/dL — AB
Specific Gravity, Urine: 1.027 (ref 1.005–1.030)
pH: 5 (ref 5.0–8.0)

## 2020-06-29 LAB — COMPREHENSIVE METABOLIC PANEL
ALT: 811 U/L — ABNORMAL HIGH (ref 0–44)
AST: 2072 U/L — ABNORMAL HIGH (ref 15–41)
Albumin: 3.7 g/dL (ref 3.5–5.0)
Alkaline Phosphatase: 99 U/L (ref 38–126)
Anion gap: 28 — ABNORMAL HIGH (ref 5–15)
BUN: 12 mg/dL (ref 6–20)
CO2: 13 mmol/L — ABNORMAL LOW (ref 22–32)
Calcium: 9.1 mg/dL (ref 8.9–10.3)
Chloride: 89 mmol/L — ABNORMAL LOW (ref 98–111)
Creatinine, Ser: 1.61 mg/dL — ABNORMAL HIGH (ref 0.44–1.00)
GFR, Estimated: 38 mL/min — ABNORMAL LOW (ref >60)
Glucose, Bld: 96 mg/dL (ref 70–99)
Potassium: 3.1 mmol/L — ABNORMAL LOW (ref 3.5–5.1)
Sodium: 130 mmol/L — ABNORMAL LOW (ref 135–145)
Total Bilirubin: 4.7 mg/dL — ABNORMAL HIGH (ref 0.3–1.2)
Total Protein: 7.1 g/dL (ref 6.5–8.1)

## 2020-06-29 LAB — PROTIME-INR
INR: 4 — ABNORMAL HIGH (ref 0.8–1.2)
Prothrombin Time: 38 seconds — ABNORMAL HIGH (ref 11.4–15.2)

## 2020-06-29 LAB — CBC
HCT: 41.7 % (ref 36.0–46.0)
Hemoglobin: 13.5 g/dL (ref 12.0–15.0)
MCH: 31.7 pg (ref 26.0–34.0)
MCHC: 32.4 g/dL (ref 30.0–36.0)
MCV: 97.9 fL (ref 80.0–100.0)
Platelets: 329 10*3/uL (ref 150–400)
RBC: 4.26 MIL/uL (ref 3.87–5.11)
RDW: 16.3 % — ABNORMAL HIGH (ref 11.5–15.5)
WBC: 11.7 10*3/uL — ABNORMAL HIGH (ref 4.0–10.5)
nRBC: 0 % (ref 0.0–0.2)

## 2020-06-29 LAB — LIPASE, BLOOD: Lipase: 1135 U/L — ABNORMAL HIGH (ref 11–51)

## 2020-06-29 LAB — ETHANOL: Alcohol, Ethyl (B): <10 mg/dL (ref <10)

## 2020-06-29 LAB — TROPONIN I (HIGH SENSITIVITY)
Troponin I (High Sensitivity): 8 ng/L (ref <18)
Troponin I (High Sensitivity): 18 ng/L — ABNORMAL HIGH (ref <18)

## 2020-06-29 MED ORDER — FOLIC ACID 1 MG PO TABS
1.0000 mg | ORAL_TABLET | Freq: Every day | ORAL | Status: DC
  Administered 2020-06-29 – 2020-06-30 (×2): 1 mg via ORAL
  Filled 2020-06-29 (×2): qty 1

## 2020-06-29 MED ORDER — POTASSIUM CHLORIDE 10 MEQ/100ML IV SOLN
10.0000 meq | INTRAVENOUS | Status: AC
  Administered 2020-06-29 (×2): 10 meq via INTRAVENOUS
  Filled 2020-06-29 (×2): qty 100

## 2020-06-29 MED ORDER — ONDANSETRON HCL 4 MG PO TABS: 4.0000 mg | ORAL_TABLET | Freq: Four times a day (QID) | ORAL | Status: DC | PRN

## 2020-06-29 MED ORDER — LORAZEPAM 2 MG/ML IJ SOLN: 0.0000 mg | Freq: Two times a day (BID) | INTRAMUSCULAR | Status: DC

## 2020-06-29 MED ORDER — LACTATED RINGERS IV SOLN: INTRAVENOUS | Status: DC

## 2020-06-29 MED ORDER — METHYLPREDNISOLONE SODIUM SUCC 40 MG IJ SOLR
32.0000 mg | Freq: Every day | INTRAMUSCULAR | Status: DC
  Administered 2020-06-29: 19:00:00 32 mg via INTRAVENOUS
  Filled 2020-06-29: qty 1

## 2020-06-29 MED ORDER — GABAPENTIN 300 MG PO CAPS
600.0000 mg | ORAL_CAPSULE | Freq: Two times a day (BID) | ORAL | Status: DC
  Administered 2020-06-30: 600 mg via ORAL
  Filled 2020-06-29: qty 2

## 2020-06-29 MED ORDER — FENTANYL CITRATE (PF) 100 MCG/2ML IJ SOLN
50.0000 ug | Freq: Once | INTRAMUSCULAR | Status: AC
  Administered 2020-06-29: 09:00:00 50 ug via INTRAVENOUS
  Filled 2020-06-29: qty 2

## 2020-06-29 MED ORDER — ENOXAPARIN SODIUM 40 MG/0.4ML ~~LOC~~ SOLN
40.0000 mg | SUBCUTANEOUS | Status: DC
  Administered 2020-06-29: 21:00:00 40 mg via SUBCUTANEOUS
  Filled 2020-06-29: qty 0.4

## 2020-06-29 MED ORDER — LORAZEPAM 2 MG/ML IJ SOLN: 1.0000 mg | INTRAMUSCULAR | Status: DC | PRN

## 2020-06-29 MED ORDER — POTASSIUM CHLORIDE 10 MEQ/100ML IV SOLN
10.0000 meq | Freq: Once | INTRAVENOUS | Status: AC
  Administered 2020-06-29: 12:00:00 10 meq via INTRAVENOUS
  Filled 2020-06-29: qty 100

## 2020-06-29 MED ORDER — THIAMINE HCL 100 MG/ML IJ SOLN
100.0000 mg | Freq: Every day | INTRAMUSCULAR | Status: DC
  Administered 2020-06-29: 20:00:00 100 mg via INTRAVENOUS
  Filled 2020-06-29: qty 2

## 2020-06-29 MED ORDER — THIAMINE HCL 100 MG PO TABS
100.0000 mg | ORAL_TABLET | Freq: Every day | ORAL | Status: DC
  Administered 2020-06-30: 11:00:00 100 mg via ORAL
  Filled 2020-06-29 (×2): qty 1

## 2020-06-29 MED ORDER — LACTATED RINGERS IV BOLUS
2000.0000 mL | Freq: Once | INTRAVENOUS | Status: AC
  Administered 2020-06-29: 09:00:00 2000 mL via INTRAVENOUS

## 2020-06-29 MED ORDER — IOHEXOL 300 MG/ML  SOLN
100.0000 mL | Freq: Once | INTRAMUSCULAR | Status: AC | PRN
  Administered 2020-06-29: 11:00:00 75 mL via INTRAVENOUS

## 2020-06-29 MED ORDER — OMEGA-3-ACID ETHYL ESTERS 1 G PO CAPS
1.0000 g | ORAL_CAPSULE | Freq: Every day | ORAL | Status: DC
  Administered 2020-06-29: 20:00:00 1 g via ORAL
  Filled 2020-06-29 (×2): qty 1

## 2020-06-29 MED ORDER — ONDANSETRON HCL 4 MG/2ML IJ SOLN
4.0000 mg | Freq: Once | INTRAMUSCULAR | Status: AC
  Administered 2020-06-29: 09:00:00 4 mg via INTRAVENOUS
  Filled 2020-06-29: qty 2

## 2020-06-29 MED ORDER — ONDANSETRON HCL 4 MG/2ML IJ SOLN: 4.0000 mg | Freq: Four times a day (QID) | INTRAMUSCULAR | Status: DC | PRN

## 2020-06-29 MED ORDER — PHENYLEPHRINE HCL-NACL 10-0.9 MG/250ML-% IV SOLN
0.0000 ug/min | INTRAVENOUS | Status: DC
  Administered 2020-06-30: 20 ug/min via INTRAVENOUS
  Filled 2020-06-29: qty 250

## 2020-06-29 MED ORDER — SODIUM CHLORIDE 0.9% FLUSH
3.0000 mL | Freq: Two times a day (BID) | INTRAVENOUS | Status: DC
  Administered 2020-06-30: 11:00:00 3 mL via INTRAVENOUS

## 2020-06-29 MED ORDER — ASPIRIN EC 81 MG PO TBEC
81.0000 mg | DELAYED_RELEASE_TABLET | Freq: Every day | ORAL | Status: DC
  Administered 2020-06-29 – 2020-06-30 (×2): 81 mg via ORAL
  Filled 2020-06-29 (×2): qty 1

## 2020-06-29 MED ORDER — LORAZEPAM 1 MG PO TABS: 1.0000 mg | ORAL_TABLET | ORAL | Status: DC | PRN

## 2020-06-29 MED ORDER — ONDANSETRON 4 MG PO TBDP: 4.0000 mg | ORAL_TABLET | Freq: Three times a day (TID) | ORAL | 0 refills | Status: AC | PRN

## 2020-06-29 MED ORDER — PANTOPRAZOLE SODIUM 40 MG PO TBEC
40.0000 mg | DELAYED_RELEASE_TABLET | Freq: Every day | ORAL | Status: DC
  Administered 2020-06-30 (×2): 40 mg via ORAL
  Filled 2020-06-29 (×2): qty 1

## 2020-06-29 MED ORDER — METOCLOPRAMIDE HCL 5 MG/ML IJ SOLN
10.0000 mg | Freq: Once | INTRAMUSCULAR | Status: AC
  Administered 2020-06-29: 12:00:00 10 mg via INTRAVENOUS
  Filled 2020-06-29: qty 2

## 2020-06-29 MED ORDER — ALBUTEROL SULFATE (2.5 MG/3ML) 0.083% IN NEBU
2.5000 mg | INHALATION_SOLUTION | Freq: Four times a day (QID) | RESPIRATORY_TRACT | Status: DC | PRN
  Administered 2020-06-30: 11:00:00 2.5 mg via RESPIRATORY_TRACT
  Filled 2020-06-29: qty 3

## 2020-06-29 MED ORDER — FENTANYL CITRATE (PF) 100 MCG/2ML IJ SOLN
25.0000 ug | INTRAMUSCULAR | Status: DC | PRN
  Administered 2020-06-29 – 2020-06-30 (×2): 25 ug via INTRAVENOUS
  Filled 2020-06-29 (×2): qty 2

## 2020-06-29 MED ORDER — ONE-A-DAY WOMENS 50 PLUS PO TABS: ORAL_TABLET | Freq: Every day | ORAL | Status: DC

## 2020-06-29 MED ORDER — ADULT MULTIVITAMIN W/MINERALS CH
1.0000 | ORAL_TABLET | Freq: Every day | ORAL | Status: DC
  Administered 2020-06-29 – 2020-06-30 (×2): 1 via ORAL
  Filled 2020-06-29 (×2): qty 1

## 2020-06-29 MED ORDER — VITAMIN D 25 MCG (1000 UNIT) PO TABS
1000.0000 [IU] | ORAL_TABLET | Freq: Every day | ORAL | Status: DC
  Administered 2020-06-29 – 2020-06-30 (×2): 1000 [IU] via ORAL
  Filled 2020-06-29 (×2): qty 1

## 2020-06-29 MED ORDER — FENTANYL CITRATE (PF) 100 MCG/2ML IJ SOLN
50.0000 ug | INTRAMUSCULAR | Status: DC | PRN
  Administered 2020-06-29 (×2): 50 ug via INTRAVENOUS
  Filled 2020-06-29 (×2): qty 2

## 2020-06-29 MED ORDER — LORAZEPAM 2 MG/ML IJ SOLN
0.0000 mg | Freq: Four times a day (QID) | INTRAMUSCULAR | Status: DC
  Filled 2020-06-29: qty 2

## 2020-06-29 MED ORDER — NICOTINE 21 MG/24HR TD PT24
21.0000 mg | MEDICATED_PATCH | Freq: Every day | TRANSDERMAL | Status: DC
  Administered 2020-06-29 – 2020-06-30 (×2): 21 mg via TRANSDERMAL
  Filled 2020-06-29 (×2): qty 1

## 2020-06-29 MED ORDER — OXYCODONE HCL 5 MG PO TABS: 5.0000 mg | ORAL_TABLET | ORAL | 0 refills | Status: AC | PRN

## 2020-06-29 NOTE — ED Notes (Signed)
Pt presents to the ED with abdominal pain and weakness since Saturday. Pt told not to drink liquor, but had a binge drinking episode of half a gallon of liquor on Friday. Pt is weak and barely able to stand. Pt noted to be hypotensive. MD at bedside for assessment. Resp even and unlabored.

## 2020-06-29 NOTE — Discharge Instructions (Signed)
You need to be on a strict liquid diet with broth, water, light-colored sodas, Jell-O until the pain improves.  Then you can slowly start adding foods back in.  Avoid all alcohol.  Make sure you speak with somebody who can check on you to make sure that you are okay.  If you start feeling worse at any time please return to the ER.  Your condition is very serious today and could lead to severe damage to your pancreas, multisystem organ dysfunction and long-term disability.

## 2020-06-29 NOTE — ED Provider Notes (Addendum)
is right CVA tenderness, left CVA tenderness and guarding. There is no rebound.  Musculoskeletal:        General: No tenderness. Normal range of motion.     Right lower leg: No edema.     Left lower leg: No edema.     Comments: No edema  Skin:    General: Skin is warm and dry.     Findings: No rash.  Neurological:     General: No focal deficit present.     Mental Status: She is alert and oriented to person, place, and time. Mental status is at baseline.     Cranial Nerves: No cranial nerve deficit.  Psychiatric:        Mood and Affect: Mood and affect and mood normal.        Behavior: Behavior normal.        Thought Content: Thought content normal.     ED Results / Procedures / Treatments   Labs (all labs ordered are listed, but only abnormal results are displayed) Labs Reviewed  LIPASE, BLOOD - Abnormal; Notable for the following components:      Result Value   Lipase 1,135 (*)    All other components within normal limits  COMPREHENSIVE METABOLIC PANEL - Abnormal; Notable for the following components:   Sodium 130 (*)    Potassium 3.1 (*)    Chloride 89 (*)    CO2 13 (*)    Creatinine, Ser 1.61 (*)    AST 2,072 (*)    ALT 811 (*)    Total Bilirubin 4.7 (*)    GFR, Estimated 38 (*)    Anion gap 28 (*)    All other components within normal limits  CBC - Abnormal; Notable for the following components:   WBC 11.7 (*)    RDW 16.3 (*)    All other components within normal  limits  LACTIC ACID, PLASMA - Abnormal; Notable for the following components:   Lactic Acid, Venous >11.0 (*)    All other components within normal limits  LACTIC ACID, PLASMA - Abnormal; Notable for the following components:   Lactic Acid, Venous >11.0 (*)    All other components within normal limits  TROPONIN I (HIGH SENSITIVITY) - Abnormal; Notable for the following components:   Troponin I (High Sensitivity) 18 (*)    All other components within normal limits  ETHANOL  URINALYSIS, ROUTINE W REFLEX MICROSCOPIC  I-STAT BETA HCG BLOOD, ED ([facility], [facility], [facility] ONLY)  TROPONIN I (HIGH SENSITIVITY)    EKG EKG Interpretation  Date/Time:  Monday June 29 2020 07:55:05 EST Ventricular Rate:  117 PR Interval:  122 QRS Duration: 70 QT Interval:  354 QTC Calculation: 493 R Axis:   70 Text Interpretation: Sinus tachycardia Right atrial enlargement No significant change since last tracing Confirmed by Gwyneth Sprout ([id number]) on 07/09/2020 8:23:57 AM   Radiology CT ABDOMEN PELVIS W CONTRAST  Result Date: 07/01/2020 CLINICAL DATA:  Abdominal pain with mild jaundice EXAM: CT ABDOMEN AND PELVIS WITH CONTRAST TECHNIQUE: Multidetector CT imaging of the abdomen and pelvis was performed using the standard protocol following bolus administration of intravenous contrast. CONTRAST:  75mL OMNIPAQUE IOHEXOL 300 MG/ML  SOLN COMPARISON:  04/24/2020 FINDINGS: Lower chest: Small right pleural effusion is noted as well as right lower lobe and right middle lobe atelectatic changes. Hepatobiliary: Diffuse fatty infiltration of the liver is noted. Previously seen hypodense lesions are again identified particularly in the left lobe of the liver. These are again incompletely characterized  is right CVA tenderness, left CVA tenderness and guarding. There is no rebound.  Musculoskeletal:        General: No tenderness. Normal range of motion.     Right lower leg: No edema.     Left lower leg: No edema.     Comments: No edema  Skin:    General: Skin is warm and dry.     Findings: No rash.  Neurological:     General: No focal deficit present.     Mental Status: She is alert and oriented to person, place, and time. Mental status is at baseline.     Cranial Nerves: No cranial nerve deficit.  Psychiatric:        Mood and Affect: Mood and affect and mood normal.        Behavior: Behavior normal.        Thought Content: Thought content normal.     ED Results / Procedures / Treatments   Labs (all labs ordered are listed, but only abnormal results are displayed) Labs Reviewed  LIPASE, BLOOD - Abnormal; Notable for the following components:      Result Value   Lipase 1,135 (*)    All other components within normal limits  COMPREHENSIVE METABOLIC PANEL - Abnormal; Notable for the following components:   Sodium 130 (*)    Potassium 3.1 (*)    Chloride 89 (*)    CO2 13 (*)    Creatinine, Ser 1.61 (*)    AST 2,072 (*)    ALT 811 (*)    Total Bilirubin 4.7 (*)    GFR, Estimated 38 (*)    Anion gap 28 (*)    All other components within normal limits  CBC - Abnormal; Notable for the following components:   WBC 11.7 (*)    RDW 16.3 (*)    All other components within normal  limits  LACTIC ACID, PLASMA - Abnormal; Notable for the following components:   Lactic Acid, Venous >11.0 (*)    All other components within normal limits  LACTIC ACID, PLASMA - Abnormal; Notable for the following components:   Lactic Acid, Venous >11.0 (*)    All other components within normal limits  TROPONIN I (HIGH SENSITIVITY) - Abnormal; Notable for the following components:   Troponin I (High Sensitivity) 18 (*)    All other components within normal limits  ETHANOL  URINALYSIS, ROUTINE W REFLEX MICROSCOPIC  I-STAT BETA HCG BLOOD, ED ([facility], [facility], [facility] ONLY)  TROPONIN I (HIGH SENSITIVITY)    EKG EKG Interpretation  Date/Time:  Monday June 29 2020 07:55:05 EST Ventricular Rate:  117 PR Interval:  122 QRS Duration: 70 QT Interval:  354 QTC Calculation: 493 R Axis:   70 Text Interpretation: Sinus tachycardia Right atrial enlargement No significant change since last tracing Confirmed by Gwyneth Sprout ([id number]) on 07/09/2020 8:23:57 AM   Radiology CT ABDOMEN PELVIS W CONTRAST  Result Date: 07/01/2020 CLINICAL DATA:  Abdominal pain with mild jaundice EXAM: CT ABDOMEN AND PELVIS WITH CONTRAST TECHNIQUE: Multidetector CT imaging of the abdomen and pelvis was performed using the standard protocol following bolus administration of intravenous contrast. CONTRAST:  75mL OMNIPAQUE IOHEXOL 300 MG/ML  SOLN COMPARISON:  04/24/2020 FINDINGS: Lower chest: Small right pleural effusion is noted as well as right lower lobe and right middle lobe atelectatic changes. Hepatobiliary: Diffuse fatty infiltration of the liver is noted. Previously seen hypodense lesions are again identified particularly in the left lobe of the liver. These are again incompletely characterized  [facility] EMERGENCY DEPARTMENT Provider Note   CSN: [id number] Arrival date & time: 07/04/2020  4098     History Chief Complaint  Patient presents with  . Shortness of Breath  . Flank Pain  . Abdominal Pain    Rebecca Oconnor is a 54 y.o. female.  Patient is a 54 year old female with a history of hypertension, heavy alcohol use, pancreatitis and liver lesion in October as well as Covid infection in October who is presenting today with bilateral flank and abdominal pain that started 2 days prior to arrival.  Patient reports that she had been doing really well and drinking a very minimal amount but on Friday she drank a lot.  She quantifies this as 1/2 gallon of liquor and some wine on Friday and she woke up Saturday with the symptoms.  She reports since Saturday the symptoms have been getting worse.  She now has diffuse abdominal pain and flank pain.  She is unable to eat.  She is induced vomiting on Saturday and Sunday without improvement of her symptoms.  She denies any diarrhea or fever or cough.  She reports today she started feeling short of breath and dizzy with standing or trying to do something.  The dizziness does improve with lying down.  She has taken none of her normal medications yesterday or today.  She denies any hemoptysis, hematemesis or dark stools.  She has been trying to take over-the-counter pain relievers but has had no improvement in her symptoms.  The history is provided by the patient.  Shortness of Breath Associated symptoms: abdominal pain and vomiting   Associated symptoms: no fever   Risk factors: alcohol use   Flank Pain Associated symptoms include abdominal pain and shortness of breath.  Abdominal Pain Associated symptoms: shortness of breath and vomiting   Associated symptoms: no fever        Past Medical History:  Diagnosis Date  . 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 Hypertension    . Peripheral neuropathy 11/22/2018  . Right carpal tunnel syndrome 11/22/2018  . STD (sexually transmitted disease)    trichomonas treated 06/2019 & 07/2019    Patient Active Problem List   Diagnosis Date Noted  . Cough 05/14/2020  . Acute pancreatitis 04/30/2020  . Liver lesion 04/30/2020  . Abdominal pain 04/30/2020  . History of COVID-19 04/30/2020  . COVID-19 04/24/2020  . Essential hypertension 08/12/2019  . Paresthesia 07/16/2019  . Peripheral neuropathy 11/22/2018  . Right carpal tunnel syndrome 11/22/2018    Past Surgical History:  Procedure Laterality Date  . FINGER SURGERY     partial amputation     OB History    Gravida  0   Para  0   Term  0   Preterm  0   AB  0   Living  0     SAB  0   IAB  0   Ectopic  0   Multiple  0   Live Births  0           Family History  Problem Relation Age of Onset  . Hypertension Mother   . Heart disease Mother   . Hypertension Father   . Hypertension Sister   . Hypertension Sister     Social History   Tobacco Use  . Smoking status: Current Every Day Smoker  . Smokeless tobacco: Never Used  . Tobacco comment: 3 a day  is right CVA tenderness, left CVA tenderness and guarding. There is no rebound.  Musculoskeletal:        General: No tenderness. Normal range of motion.     Right lower leg: No edema.     Left lower leg: No edema.     Comments: No edema  Skin:    General: Skin is warm and dry.     Findings: No rash.  Neurological:     General: No focal deficit present.     Mental Status: She is alert and oriented to person, place, and time. Mental status is at baseline.     Cranial Nerves: No cranial nerve deficit.  Psychiatric:        Mood and Affect: Mood and affect and mood normal.        Behavior: Behavior normal.        Thought Content: Thought content normal.     ED Results / Procedures / Treatments   Labs (all labs ordered are listed, but only abnormal results are displayed) Labs Reviewed  LIPASE, BLOOD - Abnormal; Notable for the following components:      Result Value   Lipase 1,135 (*)    All other components within normal limits  COMPREHENSIVE METABOLIC PANEL - Abnormal; Notable for the following components:   Sodium 130 (*)    Potassium 3.1 (*)    Chloride 89 (*)    CO2 13 (*)    Creatinine, Ser 1.61 (*)    AST 2,072 (*)    ALT 811 (*)    Total Bilirubin 4.7 (*)    GFR, Estimated 38 (*)    Anion gap 28 (*)    All other components within normal limits  CBC - Abnormal; Notable for the following components:   WBC 11.7 (*)    RDW 16.3 (*)    All other components within normal  limits  LACTIC ACID, PLASMA - Abnormal; Notable for the following components:   Lactic Acid, Venous >11.0 (*)    All other components within normal limits  LACTIC ACID, PLASMA - Abnormal; Notable for the following components:   Lactic Acid, Venous >11.0 (*)    All other components within normal limits  TROPONIN I (HIGH SENSITIVITY) - Abnormal; Notable for the following components:   Troponin I (High Sensitivity) 18 (*)    All other components within normal limits  ETHANOL  URINALYSIS, ROUTINE W REFLEX MICROSCOPIC  I-STAT BETA HCG BLOOD, ED ([facility], [facility], [facility] ONLY)  TROPONIN I (HIGH SENSITIVITY)    EKG EKG Interpretation  Date/Time:  Monday June 29 2020 07:55:05 EST Ventricular Rate:  117 PR Interval:  122 QRS Duration: 70 QT Interval:  354 QTC Calculation: 493 R Axis:   70 Text Interpretation: Sinus tachycardia Right atrial enlargement No significant change since last tracing Confirmed by Gwyneth Sprout ([id number]) on 07/09/2020 8:23:57 AM   Radiology CT ABDOMEN PELVIS W CONTRAST  Result Date: 07/01/2020 CLINICAL DATA:  Abdominal pain with mild jaundice EXAM: CT ABDOMEN AND PELVIS WITH CONTRAST TECHNIQUE: Multidetector CT imaging of the abdomen and pelvis was performed using the standard protocol following bolus administration of intravenous contrast. CONTRAST:  75mL OMNIPAQUE IOHEXOL 300 MG/ML  SOLN COMPARISON:  04/24/2020 FINDINGS: Lower chest: Small right pleural effusion is noted as well as right lower lobe and right middle lobe atelectatic changes. Hepatobiliary: Diffuse fatty infiltration of the liver is noted. Previously seen hypodense lesions are again identified particularly in the left lobe of the liver. These are again incompletely characterized  [facility] EMERGENCY DEPARTMENT Provider Note   CSN: [id number] Arrival date & time: 07/04/2020  4098     History Chief Complaint  Patient presents with  . Shortness of Breath  . Flank Pain  . Abdominal Pain    Rebecca Oconnor is a 54 y.o. female.  Patient is a 54 year old female with a history of hypertension, heavy alcohol use, pancreatitis and liver lesion in October as well as Covid infection in October who is presenting today with bilateral flank and abdominal pain that started 2 days prior to arrival.  Patient reports that she had been doing really well and drinking a very minimal amount but on Friday she drank a lot.  She quantifies this as 1/2 gallon of liquor and some wine on Friday and she woke up Saturday with the symptoms.  She reports since Saturday the symptoms have been getting worse.  She now has diffuse abdominal pain and flank pain.  She is unable to eat.  She is induced vomiting on Saturday and Sunday without improvement of her symptoms.  She denies any diarrhea or fever or cough.  She reports today she started feeling short of breath and dizzy with standing or trying to do something.  The dizziness does improve with lying down.  She has taken none of her normal medications yesterday or today.  She denies any hemoptysis, hematemesis or dark stools.  She has been trying to take over-the-counter pain relievers but has had no improvement in her symptoms.  The history is provided by the patient.  Shortness of Breath Associated symptoms: abdominal pain and vomiting   Associated symptoms: no fever   Risk factors: alcohol use   Flank Pain Associated symptoms include abdominal pain and shortness of breath.  Abdominal Pain Associated symptoms: shortness of breath and vomiting   Associated symptoms: no fever        Past Medical History:  Diagnosis Date  . 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 Hypertension    . Peripheral neuropathy 11/22/2018  . Right carpal tunnel syndrome 11/22/2018  . STD (sexually transmitted disease)    trichomonas treated 06/2019 & 07/2019    Patient Active Problem List   Diagnosis Date Noted  . Cough 05/14/2020  . Acute pancreatitis 04/30/2020  . Liver lesion 04/30/2020  . Abdominal pain 04/30/2020  . History of COVID-19 04/30/2020  . COVID-19 04/24/2020  . Essential hypertension 08/12/2019  . Paresthesia 07/16/2019  . Peripheral neuropathy 11/22/2018  . Right carpal tunnel syndrome 11/22/2018    Past Surgical History:  Procedure Laterality Date  . FINGER SURGERY     partial amputation     OB History    Gravida  0   Para  0   Term  0   Preterm  0   AB  0   Living  0     SAB  0   IAB  0   Ectopic  0   Multiple  0   Live Births  0           Family History  Problem Relation Age of Onset  . Hypertension Mother   . Heart disease Mother   . Hypertension Father   . Hypertension Sister   . Hypertension Sister     Social History   Tobacco Use  . Smoking status: Current Every Day Smoker  . Smokeless tobacco: Never Used  . Tobacco comment: 3 a day  [facility] EMERGENCY DEPARTMENT Provider Note   CSN: [id number] Arrival date & time: 07/04/2020  4098     History Chief Complaint  Patient presents with  . Shortness of Breath  . Flank Pain  . Abdominal Pain    Rebecca Oconnor is a 54 y.o. female.  Patient is a 54 year old female with a history of hypertension, heavy alcohol use, pancreatitis and liver lesion in October as well as Covid infection in October who is presenting today with bilateral flank and abdominal pain that started 2 days prior to arrival.  Patient reports that she had been doing really well and drinking a very minimal amount but on Friday she drank a lot.  She quantifies this as 1/2 gallon of liquor and some wine on Friday and she woke up Saturday with the symptoms.  She reports since Saturday the symptoms have been getting worse.  She now has diffuse abdominal pain and flank pain.  She is unable to eat.  She is induced vomiting on Saturday and Sunday without improvement of her symptoms.  She denies any diarrhea or fever or cough.  She reports today she started feeling short of breath and dizzy with standing or trying to do something.  The dizziness does improve with lying down.  She has taken none of her normal medications yesterday or today.  She denies any hemoptysis, hematemesis or dark stools.  She has been trying to take over-the-counter pain relievers but has had no improvement in her symptoms.  The history is provided by the patient.  Shortness of Breath Associated symptoms: abdominal pain and vomiting   Associated symptoms: no fever   Risk factors: alcohol use   Flank Pain Associated symptoms include abdominal pain and shortness of breath.  Abdominal Pain Associated symptoms: shortness of breath and vomiting   Associated symptoms: no fever        Past Medical History:  Diagnosis Date  . 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 Hypertension    . Peripheral neuropathy 11/22/2018  . Right carpal tunnel syndrome 11/22/2018  . STD (sexually transmitted disease)    trichomonas treated 06/2019 & 07/2019    Patient Active Problem List   Diagnosis Date Noted  . Cough 05/14/2020  . Acute pancreatitis 04/30/2020  . Liver lesion 04/30/2020  . Abdominal pain 04/30/2020  . History of COVID-19 04/30/2020  . COVID-19 04/24/2020  . Essential hypertension 08/12/2019  . Paresthesia 07/16/2019  . Peripheral neuropathy 11/22/2018  . Right carpal tunnel syndrome 11/22/2018    Past Surgical History:  Procedure Laterality Date  . FINGER SURGERY     partial amputation     OB History    Gravida  0   Para  0   Term  0   Preterm  0   AB  0   Living  0     SAB  0   IAB  0   Ectopic  0   Multiple  0   Live Births  0           Family History  Problem Relation Age of Onset  . Hypertension Mother   . Heart disease Mother   . Hypertension Father   . Hypertension Sister   . Hypertension Sister     Social History   Tobacco Use  . Smoking status: Current Every Day Smoker  . Smokeless tobacco: Never Used  . Tobacco comment: 3 a day

## 2020-06-29 NOTE — H&P (Addendum)
History and Physical/discharge summary   Rebecca Oconnor:[id number] DOB: 1965/09/08 DOA: 07/09/2020  Referring MD/NP/PA: Gwyneth Sprout, MD PCP: Arvilla Market, DO  Patient coming from: Home  Chief Complaint: Abdominal pain  I have personally briefly reviewed patient's old medical records in [facility]   HPI: Rebecca Oconnor is a 54 y.o. female with medical history significant of hypertension,  alcohol abuse, and COVID-19 in October 2021 presented with complaints of epigastric abdominal pain over the last 2 days.  She stated that she drinks 4 to 5 cups of wine daily on average.  3 days ago, patient had a binge episode reporting possibly drinking half a gallon of liquor/beer/wine.  The following morning she woke up with severe crampy epigastric pain that she rated as a 10 out of 10 on the pain scale.  She tried drinking water and ginger ale.  Noted associated symptoms of lightheadedness, nausea, and she made herself vomit at least twice.  Since Friday she reports that she has not drank any alcohol.  Patient had been seen in October and was diagnosed with pancreatitis at that time, but was able to be discharged home.  She also has been diagnosed with Covid earlier part of October.  ED Course: Upon admission into the emergency department patient was in to be afebrile, heart rate 87-117, blood pressure 73/55-105/69, and all other vital signs maintained.  Labs significant for WBC 11.7, sodium 130, potassium 3.1 chloride 89, CO2 13, BUN 12, creatinine 1.61, anion gap 28, glucose 96, lipase 1135, AST 2072, ALT 811, total bilirubin 4.1, and lactic acid >11 x2.  Urinalysis was negative for any significant signs of infection.  Chest x-ray showed a small right pleural effusion with right-sided atelectasis.  Patient was bolused 2 L of lactated Ringer's IV, given fentanyl 50 mcg IV, Zofran, and Reglan.  TRH called to admit.  Requested PT/INR be obtained so that Maberry's discriminant function  score could be calculated.  Patient tries to leave multiple times.  Review of Systems  Constitutional: Positive for malaise/fatigue. Negative for fever.  HENT: Negative for congestion and nosebleeds.        Positive for chronic hoarseness of voice  Eyes: Negative for photophobia and pain.  Respiratory: Negative for cough and shortness of breath.   Cardiovascular: Negative for chest pain and leg swelling.  Gastrointestinal: Positive for abdominal pain, nausea and vomiting.  Genitourinary: Negative for dysuria and hematuria.  Musculoskeletal: Negative for falls.  Skin: Negative for rash.  Neurological: Positive for dizziness and weakness. Negative for loss of consciousness.  Psychiatric/Behavioral: Positive for substance abuse. Negative for memory loss.    Past Medical History:  Diagnosis Date  . 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 Hypertension   . Peripheral neuropathy 11/22/2018  . Right carpal tunnel syndrome 11/22/2018  . STD (sexually transmitted disease)    trichomonas treated 06/2019 & 07/2019    Past Surgical History:  Procedure Laterality Date  . FINGER SURGERY     partial amputation     reports that she has been smoking. She has never used smokeless tobacco. She reports current alcohol use. She reports previous drug use.  No Known Allergies  Family History  Problem Relation Age of Onset  . Hypertension Mother   . Heart disease Mother   . Hypertension Father   . Hypertension Sister   . Hypertension Sister     Prior to Admission medications   Medication  Sig Start Date End Date Taking? Authorizing Provider  amLODipine (NORVASC) 5 MG tablet Take 1 tablet (5 mg total) by mouth daily. 03/20/20   Arvilla Market, DO  cetirizine-pseudoephedrine (ZYRTEC-D) 5-120 MG tablet Take 1 tablet by mouth daily. 04/17/20   Moshe Cipro, NP  fluticasone (FLONASE) 50 MCG/ACT nasal spray Place 2 sprays into both nostrils  daily. 04/17/20   Moshe Cipro, NP  gabapentin (NEURONTIN) 600 MG tablet Take 1 tablet (600 mg total) by mouth 4 (four) times daily. 03/25/20   Glean Salvo, NP  Multiple Vitamins-Minerals (ONE-A-DAY WOMENS 50 PLUS PO) Take 1 tablet by mouth daily.    [provider]  Omega-3 Fatty Acids (FISH OIL) 1000 MG CAPS Take 1 capsule by mouth daily.    [provider]  omeprazole (PRILOSEC) 20 MG capsule Take 1 capsule (20 mg total) by mouth daily. 02/20/20   Arvilla Market, DO  ondansetron (ZOFRAN ODT) 4 MG disintegrating tablet Take 1 tablet (4 mg total) by mouth every 8 (eight) hours as needed for nausea or vomiting. 11-Jul-2020   Gwyneth Sprout, MD  oxyCODONE (ROXICODONE) 5 MG immediate release tablet Take 1 tablet (5 mg total) by mouth every 4 (four) hours as needed for severe pain. 07/11/20   Gwyneth Sprout, MD  oxyCODONE-acetaminophen (PERCOCET) 5-325 MG tablet Take 1 tablet by mouth every 6 (six) hours as needed. 04/25/20   Gailen Shelter, PA  potassium chloride SA (KLOR-CON) 20 MEQ tablet Take 2 tablets (40 mEq total) by mouth daily. 04/25/20   Gailen Shelter, PA  rosuvastatin (CRESTOR) 10 MG tablet Take 1 tablet (10 mg total) by mouth daily. 02/20/20   Arvilla Market, DO  valsartan-hydrochlorothiazide (DIOVAN-HCT) 320-25 MG tablet Take 1 tablet by mouth daily. 03/20/20   Arvilla Market, DO    Physical Exam:  Constitutional: Elderly female who appears ill Vitals:   07/11/20 1045 2020/07/11 1155 07-11-20 1315 2020/07/11 1400  BP: 90/63 (!) 105/57 94/67 100/66  Pulse: 87 90 88 84  Resp: 18 13 18 17   Temp:      TempSrc:      SpO2: 99% 97% 99% 100%   Eyes: PERRL, scleral icterus present ENMT: Mucous membranes are moist. Posterior pharynx clear of any exudate or lesions.   Neck: normal, supple, no masses, no thyromegaly Respiratory: clear to auscultation bilaterally, no wheezing, no crackles. Normal respiratory effort. No accessory  muscle use.  Cardiovascular: Regular rate and rhythm, no murmurs / rubs / gallops. No extremity edema. 2+ pedal pulses. No carotid bruits.  Abdomen: Epigastric abdominal tenderness appreciated.  Bowel sounds present Musculoskeletal: no clubbing / cyanosis. No joint deformity upper and lower extremities. Good ROM, no contractures. Normal muscle tone.  Skin: no rashes, lesions, ulcers. No induration Neurologic: CN 2-12 grossly intact. Sensation intact, DTR normal. Strength 5/5 in all 4.  Psychiatric: Poor judgment and insight. Alert and oriented x 3. Normal mood.     Labs on Admission: I have personally reviewed following labs and imaging studies  CBC: Recent Labs  Lab 11-Jul-2020 0850  WBC 11.7*  HGB 13.5  HCT 41.7  MCV 97.9  PLT 329   Basic Metabolic Panel: Recent Labs  Lab 07/11/20 0850  NA 130*  K 3.1*  CL 89*  CO2 13*  GLUCOSE 96  BUN 12  CREATININE 1.61*  CALCIUM 9.1   GFR: CrCl cannot be calculated (Unknown ideal weight.). Liver Function Tests: Recent Labs  Lab 07/11/20 0850  AST 2,072*  ALT 811*  ALKPHOS 99  BILITOT 4.7*  PROT 7.1  ALBUMIN 3.7   Recent Labs  Lab 06/22/2020 0850  LIPASE 1,135*   No results for input(s): AMMONIA in the last 168 hours. Coagulation Profile: No results for input(s): INR, PROTIME in the last 168 hours. Cardiac Enzymes: No results for input(s): CKTOTAL, CKMB, CKMBINDEX, TROPONINI in the last 168 hours. BNP (last 3 results) No results for input(s): PROBNP in the last 8760 hours. HbA1C: No results for input(s): HGBA1C in the last 72 hours. CBG: No results for input(s): GLUCAP in the last 168 hours. Lipid Profile: No results for input(s): CHOL, HDL, LDLCALC, TRIG, CHOLHDL, LDLDIRECT in the last 72 hours. Thyroid Function Tests: No results for input(s): TSH, T4TOTAL, FREET4, T3FREE, THYROIDAB in the last 72 hours. Anemia Panel: No results for input(s): VITAMINB12, FOLATE, FERRITIN, TIBC, IRON, RETICCTPCT in the last 72  hours. Urine analysis:    Component Value Date/Time   COLORURINE YELLOW 06/19/2020 1415   APPEARANCEUR HAZY (A) 06/19/2020 1415   LABSPEC 1.027 06/23/2020 1415   PHURINE 5.0 06/11/2020 1415   GLUCOSEU NEGATIVE 07/06/2020 1415   HGBUR NEGATIVE 07/05/2020 1415   BILIRUBINUR NEGATIVE 06/18/2020 1415   BILIRUBINUR n 07/17/2019 1542   KETONESUR NEGATIVE 07/02/2020 1415   PROTEINUR 30 (A) 07/08/2020 1415   UROBILINOGEN negative (A) 07/17/2019 1542   NITRITE NEGATIVE 06/26/2020 1415   LEUKOCYTESUR NEGATIVE 07/03/2020 1415   Sepsis Labs: No results found for this or any previous visit (from the past 240 hour(s)).   Radiological Exams on Admission: CT ABDOMEN PELVIS W CONTRAST  Result Date: 07/08/2020 CLINICAL DATA:  Abdominal pain with mild jaundice EXAM: CT ABDOMEN AND PELVIS WITH CONTRAST TECHNIQUE: Multidetector CT imaging of the abdomen and pelvis was performed using the standard protocol following bolus administration of intravenous contrast. CONTRAST:  75mL OMNIPAQUE IOHEXOL 300 MG/ML  SOLN COMPARISON:  04/24/2020 FINDINGS: Lower chest: Small right pleural effusion is noted as well as right lower lobe and right middle lobe atelectatic changes. Hepatobiliary: Diffuse fatty infiltration of the liver is noted. Previously seen hypodense lesions are again identified particularly in the left lobe of the liver. These are again incompletely characterized on this exam. The gallbladder is well distended inflammatory change. Pancreas: Pancreas is well visualized and demonstrates peripancreatic inflammatory change and fluid that extends along Gerota's fascia bilaterally and also in the anterior omentum causing some localized inflammatory change of the transverse colon. No evidence of pancreatic necrosis is noted on delayed images. Spleen: Normal in size without focal abnormality. Adrenals/Urinary Tract: Adrenal glands show evidence of a 1.4 cm hypodense lesion better visualized than on the prior exam due  to contrast enhancement likely representing a small adenoma. Kidneys demonstrate cystic change on the left. No renal calculi or obstructive changes are noted. Delayed images demonstrate normal excretion of contrast material. No obstructive changes are noted. The bladder is partially distended. Stomach/Bowel: Colon demonstrates some mild inflammatory change in the transverse colon related to the underlying pancreatitis. No obstructive changes are seen. The appendix is within normal limits. Small bowel and stomach appear within normal limits. Vascular/Lymphatic: Aortic atherosclerosis. No enlarged abdominal or pelvic lymph nodes. Reproductive: Uterus and bilateral adnexa are unremarkable. Other: No hernia is identified. Mild ascites is seen related to the underlying pancreatitis. Musculoskeletal: No acute or significant osseous findings. IMPRESSION: Changes of pancreatitis with peripancreatic inflammatory change which extends inferiorly as well as anteriorly causing some compensatory changes in the transverse colon. No pseudocyst or pancreatic necrosis is noted at this time. Right-sided pleural  effusion and mild right middle and lower lobe atelectatic changes. Hypodense lesion in the left adrenal better visualized on the current exam consistent with a small adenoma. Stable hypodense lesions within the liver. Again, nonemergent MRI would be helpful for characterization. Electronically Signed   By: Alcide Clever M.D.   On: 06/28/2020 11:42   DG Chest Portable 1 View  Result Date: 07/08/2020 CLINICAL DATA:  SOB,HX HTN EXAM: PORTABLE CHEST - 1 VIEW COMPARISON:  07/02/2019 FINDINGS: New small right effusion. Mass-like consolidation/atelectasis laterally at the right lung base. Left lung clear. Heart size and mediastinal contours are within normal limits. No pneumothorax. Visualized bones unremarkable. IMPRESSION: New small right pleural effusion and right lower lobe consolidation/atelectasis. Consider Followup PA and  lateral chest X-ray in 3-4 weeks following trial of antibiotic therapy to ensure resolution and exclude underlying malignancy. Electronically Signed   By: Corlis Leak M.D.   On: 06/20/2020 09:33    EKG: Independently reviewed.  Sinus tachycardia 117 bpm with QTc 493   Assessment/Plan Alcohol pancreatitis: Acute.  Patient presents with epigastric abdominal pain.  Labs significant for lipase 1135.  CT imaging of the abdomen pelvis significant for pancreatitis without signs of pseudocyst or necrosis.  Patient had been given 2 L of lactated Ringer's. -Admit to a medical telemetry bed -N.p.o. for now -Lactated Ringer's at 250 mL/h as tolerated -IV fentanyl as needed for pain  Elevated liver enzymes secondary to alcoholic hepatitis: Acute.  Patient noted to have elevated AST 2072, ALT 811, and total bilirubin 4.7.  Prothrombin time was elevated at 38 giving a Maddrey discriminant function score of 119.7. -Solu-Medrol 32 mg IV daily -Recheck liver enzymes in a.m.  Transient hypotension: Acute.  Blood pressures initially noted to be 73/55, but improved with IV fluids. -Hold home blood pressure medications for now  Leukocytosis: Acute.  WBC elevated 11.7.  Suspect reactive to above. -Continue to monitor  Alcohol abuse: Patient's initial alcohol level was < 10.  Reported possibly drinking 4 to 5 cups of wine per day prior to a recent binge. -CIWA protocols with Ativan IV as needed -Transitions of care consult for alcohol abuse  Metabolic acidosis with elevated anion gap lactic acidosis: Acute.  Patient presented with lactic acid level greater than 11, CO2 13, and initial anion gap of 28. -Continue to monitor   Suspected acute kidney injury: Patient presents with creatinine elevated at 1.61 on with BUN 12.  Urinalysis revealed a specific gravity at the upper limits of normal.   Creatinine had been noted to be as low as 0.85 in February of this year. -Continue monitor kidney function  Recent  COVID-19 infection: Initially diagnosed in October 8.  -Patient within the 90-day window  GERD -Continue proton Protonix    Patient repeatedly tried to leave AGAINST MEDICAL ADVICE following the emergency department despite multiple talks with the patient in regards to the severity of her current situation.  DVT prophylaxis: Lovenox Code Status: Full  Family Communication: none Disposition Plan: Hopefully discharge home once medically Consults called: None Admission status: Inpatient status requiring more than 2 midnight stay for need of aggressive IV fluids  Clydie Braun MD [facility]   If 7PM-7AM, please contact night-coverage   06/13/2020, 3:31 PM

## 2020-06-29 NOTE — ED Notes (Signed)
Pt ambulated to bathroom with assistance. Pt states she feels weak. Pt attempted to urinate for UA sample but is unable to urinate. Pt placed back in bed. Maintenance fluids infusing.

## 2020-06-30 ENCOUNTER — Inpatient Hospital Stay (HOSPITAL_COMMUNITY): Payer: No Typology Code available for payment source

## 2020-06-30 DIAGNOSIS — K8521 Alcohol induced acute pancreatitis with uninfected necrosis: Secondary | ICD-10-CM

## 2020-06-30 DIAGNOSIS — I469 Cardiac arrest, cause unspecified: Secondary | ICD-10-CM

## 2020-06-30 DIAGNOSIS — E861 Hypovolemia: Secondary | ICD-10-CM

## 2020-06-30 DIAGNOSIS — J9601 Acute respiratory failure with hypoxia: Secondary | ICD-10-CM

## 2020-06-30 DIAGNOSIS — N179 Acute kidney failure, unspecified: Secondary | ICD-10-CM

## 2020-06-30 DIAGNOSIS — I9589 Other hypotension: Secondary | ICD-10-CM | POA: Insufficient documentation

## 2020-06-30 DIAGNOSIS — K859 Acute pancreatitis without necrosis or infection, unspecified: Secondary | ICD-10-CM | POA: Diagnosis present

## 2020-06-30 LAB — CBC
HCT: 40.8 % (ref 36.0–46.0)
HCT: 36 % (ref 36.0–46.0)
Hemoglobin: 12.5 g/dL (ref 12.0–15.0)
Hemoglobin: 10.6 g/dL — ABNORMAL LOW (ref 12.0–15.0)
MCH: 32.9 pg (ref 26.0–34.0)
MCH: 31.4 pg (ref 26.0–34.0)
MCHC: 30.6 g/dL (ref 30.0–36.0)
MCHC: 29.4 g/dL — ABNORMAL LOW (ref 30.0–36.0)
MCV: 107.4 fL — ABNORMAL HIGH (ref 80.0–100.0)
MCV: 106.5 fL — ABNORMAL HIGH (ref 80.0–100.0)
Platelets: 240 10*3/uL (ref 150–400)
Platelets: 165 10*3/uL (ref 150–400)
RBC: 3.8 MIL/uL — ABNORMAL LOW (ref 3.87–5.11)
RBC: 3.38 MIL/uL — ABNORMAL LOW (ref 3.87–5.11)
RDW: 17.1 % — ABNORMAL HIGH (ref 11.5–15.5)
RDW: 15.8 % — ABNORMAL HIGH (ref 11.5–15.5)
WBC: 14.9 10*3/uL — ABNORMAL HIGH (ref 4.0–10.5)
WBC: 5.9 10*3/uL (ref 4.0–10.5)
nRBC: 0.2 % (ref 0.0–0.2)
nRBC: 1.2 % — ABNORMAL HIGH (ref 0.0–0.2)

## 2020-06-30 LAB — I-STAT ARTERIAL BLOOD GAS, ED
Acid-base deficit: 27 mmol/L — ABNORMAL HIGH (ref 0.0–2.0)
Acid-base deficit: 27 mmol/L — ABNORMAL HIGH (ref 0.0–2.0)
Acid-base deficit: 25 mmol/L — ABNORMAL HIGH (ref 0.0–2.0)
Bicarbonate: 4.2 mmol/L — ABNORMAL LOW (ref 20.0–28.0)
Bicarbonate: 4.1 mmol/L — ABNORMAL LOW (ref 20.0–28.0)
Bicarbonate: 4.8 mmol/L — ABNORMAL LOW (ref 20.0–28.0)
Calcium, Ion: 1.01 mmol/L — ABNORMAL LOW (ref 1.15–1.40)
Calcium, Ion: 1 mmol/L — ABNORMAL LOW (ref 1.15–1.40)
Calcium, Ion: 0.95 mmol/L — ABNORMAL LOW (ref 1.15–1.40)
HCT: 42 % (ref 36.0–46.0)
HCT: 42 % (ref 36.0–46.0)
HCT: 41 % (ref 36.0–46.0)
Hemoglobin: 14.3 g/dL (ref 12.0–15.0)
Hemoglobin: 14.3 g/dL (ref 12.0–15.0)
Hemoglobin: 13.9 g/dL (ref 12.0–15.0)
O2 Saturation: 92 %
O2 Saturation: 92 %
O2 Saturation: 85 %
Patient temperature: 97.8
Patient temperature: 97.8
Potassium: 4.3 mmol/L (ref 3.5–5.1)
Potassium: 4.4 mmol/L (ref 3.5–5.1)
Potassium: 4.4 mmol/L (ref 3.5–5.1)
Sodium: 125 mmol/L — ABNORMAL LOW (ref 135–145)
Sodium: 126 mmol/L — ABNORMAL LOW (ref 135–145)
Sodium: 126 mmol/L — ABNORMAL LOW (ref 135–145)
TCO2: <5 mmol/L — ABNORMAL LOW (ref 22–32)
TCO2: <5 mmol/L — ABNORMAL LOW (ref 22–32)
TCO2: 5 mmol/L — ABNORMAL LOW (ref 22–32)
pCO2 arterial: 18.5 mmHg — CL (ref 32.0–48.0)
pCO2 arterial: 18.4 mmHg — CL (ref 32.0–48.0)
pCO2 arterial: 19.1 mmHg — CL (ref 32.0–48.0)
pH, Arterial: 6.957 — CL (ref 7.350–7.450)
pH, Arterial: 6.956 — CL (ref 7.350–7.450)
pH, Arterial: 7.004 — CL (ref 7.350–7.450)
pO2, Arterial: 96 mmHg (ref 83.0–108.0)
pO2, Arterial: 96 mmHg (ref 83.0–108.0)
pO2, Arterial: 74 mmHg — ABNORMAL LOW (ref 83.0–108.0)

## 2020-06-30 LAB — RAPID URINE DRUG SCREEN, HOSP PERFORMED
Amphetamines: NOT DETECTED
Barbiturates: NOT DETECTED
Benzodiazepines: NOT DETECTED
Cocaine: POSITIVE — AB
Opiates: NOT DETECTED
Tetrahydrocannabinol: NOT DETECTED

## 2020-06-30 LAB — COMPREHENSIVE METABOLIC PANEL
ALT: 1551 U/L — ABNORMAL HIGH (ref 0–44)
ALT: 1936 U/L — ABNORMAL HIGH (ref 0–44)
ALT: 2052 U/L — ABNORMAL HIGH (ref 0–44)
AST: >10000 U/L — ABNORMAL HIGH (ref 15–41)
AST: >10000 U/L — ABNORMAL HIGH (ref 15–41)
Albumin: 2.9 g/dL — ABNORMAL LOW (ref 3.5–5.0)
Albumin: 2.8 g/dL — ABNORMAL LOW (ref 3.5–5.0)
Albumin: 2.8 g/dL — ABNORMAL LOW (ref 3.5–5.0)
Alkaline Phosphatase: 176 U/L — ABNORMAL HIGH (ref 38–126)
Alkaline Phosphatase: 188 U/L — ABNORMAL HIGH (ref 38–126)
Alkaline Phosphatase: 154 U/L — ABNORMAL HIGH (ref 38–126)
Anion gap: 31 — ABNORMAL HIGH (ref 5–15)
BUN: 9 mg/dL (ref 6–20)
BUN: 11 mg/dL (ref 6–20)
BUN: 11 mg/dL (ref 6–20)
CO2: <7 mmol/L — ABNORMAL LOW (ref 22–32)
CO2: 8 mmol/L — ABNORMAL LOW (ref 22–32)
Calcium: 7.7 mg/dL — ABNORMAL LOW (ref 8.9–10.3)
Calcium: 7.5 mg/dL — ABNORMAL LOW (ref 8.9–10.3)
Calcium: 6.9 mg/dL — ABNORMAL LOW (ref 8.9–10.3)
Chloride: 89 mmol/L — ABNORMAL LOW (ref 98–111)
Chloride: 92 mmol/L — ABNORMAL LOW (ref 98–111)
Chloride: 89 mmol/L — ABNORMAL LOW (ref 98–111)
Creatinine, Ser: 1.88 mg/dL — ABNORMAL HIGH (ref 0.44–1.00)
Creatinine, Ser: 2.22 mg/dL — ABNORMAL HIGH (ref 0.44–1.00)
Creatinine, Ser: 2.42 mg/dL — ABNORMAL HIGH (ref 0.44–1.00)
GFR, Estimated: 32 mL/min — ABNORMAL LOW (ref >60)
GFR, Estimated: 26 mL/min — ABNORMAL LOW (ref >60)
GFR, Estimated: 23 mL/min — ABNORMAL LOW (ref >60)
Glucose, Bld: 54 mg/dL — ABNORMAL LOW (ref 70–99)
Glucose, Bld: 84 mg/dL (ref 70–99)
Glucose, Bld: 171 mg/dL — ABNORMAL HIGH (ref 70–99)
Potassium: 7.1 mmol/L (ref 3.5–5.1)
Potassium: 4.3 mmol/L (ref 3.5–5.1)
Potassium: 4.2 mmol/L (ref 3.5–5.1)
Sodium: 128 mmol/L — ABNORMAL LOW (ref 135–145)
Sodium: 127 mmol/L — ABNORMAL LOW (ref 135–145)
Sodium: 128 mmol/L — ABNORMAL LOW (ref 135–145)
Total Bilirubin: 5.5 mg/dL — ABNORMAL HIGH (ref 0.3–1.2)
Total Bilirubin: 5 mg/dL — ABNORMAL HIGH (ref 0.3–1.2)
Total Bilirubin: 4.5 mg/dL — ABNORMAL HIGH (ref 0.3–1.2)
Total Protein: 5.5 g/dL — ABNORMAL LOW (ref 6.5–8.1)
Total Protein: 5.3 g/dL — ABNORMAL LOW (ref 6.5–8.1)
Total Protein: 5 g/dL — ABNORMAL LOW (ref 6.5–8.1)

## 2020-06-30 LAB — HIV ANTIBODY (ROUTINE TESTING W REFLEX): HIV Screen 4th Generation wRfx: NONREACTIVE

## 2020-06-30 LAB — CBG MONITORING, ED
Glucose-Capillary: <10 mg/dL — CL (ref 70–99)
Glucose-Capillary: 147 mg/dL — ABNORMAL HIGH (ref 70–99)
Glucose-Capillary: 109 mg/dL — ABNORMAL HIGH (ref 70–99)

## 2020-06-30 LAB — ACETAMINOPHEN LEVEL
Acetaminophen (Tylenol), Serum: 195 ug/mL (ref 10–30)
Acetaminophen (Tylenol), Serum: 150 ug/mL — ABNORMAL HIGH (ref 10–30)

## 2020-06-30 LAB — POCT I-STAT 7, (LYTES, BLD GAS, ICA,H+H)
Acid-base deficit: 22 mmol/L — ABNORMAL HIGH (ref 0.0–2.0)
Bicarbonate: 10.1 mmol/L — ABNORMAL LOW (ref 20.0–28.0)
Calcium, Ion: 0.89 mmol/L — CL (ref 1.15–1.40)
HCT: 36 % (ref 36.0–46.0)
Hemoglobin: 12.2 g/dL (ref 12.0–15.0)
O2 Saturation: 93 %
Patient temperature: 93.2
Potassium: 4 mmol/L (ref 3.5–5.1)
Sodium: 126 mmol/L — ABNORMAL LOW (ref 135–145)
TCO2: 12 mmol/L — ABNORMAL LOW (ref 22–32)
pCO2 arterial: 44.9 mmHg (ref 32.0–48.0)
pH, Arterial: 6.94 — CL (ref 7.350–7.450)
pO2, Arterial: 95 mmHg (ref 83.0–108.0)

## 2020-06-30 LAB — TRIGLYCERIDES: Triglycerides: 59 mg/dL (ref <150)

## 2020-06-30 LAB — RENAL FUNCTION PANEL
Albumin: 2.6 g/dL — ABNORMAL LOW (ref 3.5–5.0)
Anion gap: 30 — ABNORMAL HIGH (ref 5–15)
BUN: 11 mg/dL (ref 6–20)
CO2: 10 mmol/L — ABNORMAL LOW (ref 22–32)
Calcium: 7.6 mg/dL — ABNORMAL LOW (ref 8.9–10.3)
Chloride: 91 mmol/L — ABNORMAL LOW (ref 98–111)
Creatinine, Ser: 2.43 mg/dL — ABNORMAL HIGH (ref 0.44–1.00)
GFR, Estimated: 23 mL/min — ABNORMAL LOW (ref >60)
Glucose, Bld: 92 mg/dL (ref 70–99)
Phosphorus: 9.3 mg/dL — ABNORMAL HIGH (ref 2.5–4.6)
Potassium: 4.5 mmol/L (ref 3.5–5.1)
Sodium: 131 mmol/L — ABNORMAL LOW (ref 135–145)

## 2020-06-30 LAB — BILIRUBIN, DIRECT: Bilirubin, Direct: 2.4 mg/dL — ABNORMAL HIGH (ref 0.0–0.2)

## 2020-06-30 LAB — MAGNESIUM
Magnesium: 2.1 mg/dL (ref 1.7–2.4)
Magnesium: 1.8 mg/dL (ref 1.7–2.4)

## 2020-06-30 LAB — RESP PANEL BY RT-PCR (FLU A&B, COVID) ARPGX2
Influenza A by PCR: NEGATIVE
Influenza B by PCR: NEGATIVE
SARS Coronavirus 2 by RT PCR: NEGATIVE

## 2020-06-30 LAB — GLUCOSE, CAPILLARY
Glucose-Capillary: 69 mg/dL — ABNORMAL LOW (ref 70–99)
Glucose-Capillary: 139 mg/dL — ABNORMAL HIGH (ref 70–99)

## 2020-06-30 LAB — PHOSPHORUS: Phosphorus: 8.7 mg/dL — ABNORMAL HIGH (ref 2.5–4.6)

## 2020-06-30 LAB — CORTISOL: Cortisol, Plasma: 51.3 ug/dL

## 2020-06-30 LAB — PROTIME-INR
INR: >10 (ref 0.8–1.2)
Prothrombin Time: >90 seconds — ABNORMAL HIGH (ref 11.4–15.2)

## 2020-06-30 LAB — HEPATITIS PANEL, ACUTE
HCV Ab: NONREACTIVE
Hep A IgM: REACTIVE — AB
Hep B C IgM: NONREACTIVE
Hepatitis B Surface Ag: NONREACTIVE

## 2020-06-30 LAB — LACTIC ACID, PLASMA
Lactic Acid, Venous: >11 mmol/L (ref 0.5–1.9)
Lactic Acid, Venous: >11 mmol/L (ref 0.5–1.9)

## 2020-06-30 LAB — TSH: TSH: 0.578 u[IU]/mL (ref 0.350–4.500)

## 2020-06-30 LAB — MRSA PCR SCREENING: MRSA by PCR: NEGATIVE

## 2020-06-30 MED ORDER — CALCIUM GLUCONATE-NACL 2-0.675 GM/100ML-% IV SOLN
2.0000 g | Freq: Once | INTRAVENOUS | Status: DC
  Filled 2020-06-30: qty 100

## 2020-06-30 MED ORDER — SODIUM CHLORIDE 0.9 % IV SOLN
15.0000 mg/kg | Freq: Once | INTRAVENOUS | Status: AC
  Administered 2020-06-30: 16:00:00 1130 mg via INTRAVENOUS
  Filled 2020-06-30 (×2): qty 1.13

## 2020-06-30 MED ORDER — SODIUM BICARBONATE 8.4 % IV SOLN
INTRAVENOUS | Status: AC
  Administered 2020-06-30: 13:00:00 100 meq via INTRAVENOUS
  Filled 2020-06-30: qty 50

## 2020-06-30 MED ORDER — STERILE WATER FOR INJECTION IV SOLN
INTRAVENOUS | Status: DC
  Filled 2020-06-30 (×2): qty 850

## 2020-06-30 MED ORDER — PHYTONADIONE 5 MG PO TABS
10.0000 mg | ORAL_TABLET | Freq: Once | ORAL | Status: AC
  Administered 2020-06-30: 17:00:00 10 mg via ORAL
  Filled 2020-06-30: qty 2

## 2020-06-30 MED ORDER — MAGNESIUM SULFATE 4 GM/100ML IV SOLN
4.0000 g | Freq: Once | INTRAVENOUS | Status: AC
  Filled 2020-06-30: qty 100

## 2020-06-30 MED ORDER — PRISMASOL BGK 4/2.5 32-4-2.5 MEQ/L EC SOLN
Status: DC
  Filled 2020-06-30 (×8): qty 5000

## 2020-06-30 MED ORDER — NOREPINEPHRINE 4 MG/250ML-% IV SOLN
0.0000 ug/min | INTRAVENOUS | Status: DC
  Administered 2020-06-30: 18:00:00 35 ug/min via INTRAVENOUS
  Filled 2020-06-30 (×2): qty 250

## 2020-06-30 MED ORDER — NOREPINEPHRINE 4 MG/250ML-% IV SOLN
2.0000 ug/min | INTRAVENOUS | Status: DC
  Administered 2020-06-30: 03:00:00 2 ug/min via INTRAVENOUS
  Administered 2020-06-30: 13:00:00 6 ug/min via INTRAVENOUS
  Filled 2020-06-30 (×2): qty 250

## 2020-06-30 MED ORDER — MAGNESIUM SULFATE 2 GM/50ML IV SOLN
INTRAVENOUS | Status: AC
  Administered 2020-06-30: 20:00:00 2 g via INTRAVENOUS
  Filled 2020-06-30: qty 50

## 2020-06-30 MED ORDER — FOLIC ACID 5 MG/ML IJ SOLN
1.0000 mg | Freq: Every day | INTRAMUSCULAR | Status: DC
  Administered 2020-06-30: 16:00:00 1 mg via INTRAVENOUS
  Filled 2020-06-30 (×2): qty 0.2

## 2020-06-30 MED ORDER — NOREPINEPHRINE 16 MG/250ML-% IV SOLN
0.0000 ug/min | INTRAVENOUS | Status: DC
  Administered 2020-06-30: 20:00:00 75 ug/min via INTRAVENOUS
  Filled 2020-06-30: qty 250

## 2020-06-30 MED ORDER — HEPARIN (PORCINE) 2000 UNITS/L FOR CRRT
INTRAVENOUS_CENTRAL | Status: DC | PRN
  Filled 2020-06-30: qty 1000

## 2020-06-30 MED ORDER — STERILE WATER FOR INJECTION IV SOLN
INTRAVENOUS | Status: DC
  Filled 2020-06-30 (×6): qty 150

## 2020-06-30 MED ORDER — SODIUM BICARBONATE 8.4 % IV SOLN: 100.0000 meq | Freq: Once | INTRAVENOUS | Status: AC

## 2020-06-30 MED ORDER — ROCURONIUM BROMIDE 10 MG/ML (PF) SYRINGE
PREFILLED_SYRINGE | INTRAVENOUS | Status: AC
  Administered 2020-06-30: 15:00:00 100 mg
  Filled 2020-06-30: qty 10

## 2020-06-30 MED ORDER — SODIUM BICARBONATE 8.4 % IV SOLN
INTRAVENOUS | Status: AC
  Administered 2020-06-30: 20:00:00 100 meq via INTRAVENOUS
  Filled 2020-06-30: qty 100

## 2020-06-30 MED ORDER — SODIUM BICARBONATE 8.4 % IV SOLN
INTRAVENOUS | Status: AC
  Administered 2020-06-30: 21:00:00 100 meq
  Filled 2020-06-30: qty 100

## 2020-06-30 MED ORDER — PANTOPRAZOLE SODIUM 40 MG IV SOLR
40.0000 mg | INTRAVENOUS | Status: DC
  Administered 2020-06-30: 17:00:00 40 mg via INTRAVENOUS
  Filled 2020-06-30: qty 40

## 2020-06-30 MED ORDER — VASOPRESSIN 20 UNITS/100 ML INFUSION FOR SHOCK
0.0300 [IU]/min | INTRAVENOUS | Status: DC
  Administered 2020-06-30: 18:00:00 0.03 [IU]/min via INTRAVENOUS
  Filled 2020-06-30: qty 100

## 2020-06-30 MED ORDER — ETOMIDATE 2 MG/ML IV SOLN
INTRAVENOUS | Status: AC
  Filled 2020-06-30: qty 10

## 2020-06-30 MED ORDER — PHENYLEPHRINE 40 MCG/ML (10ML) SYRINGE FOR IV PUSH (FOR BLOOD PRESSURE SUPPORT)
PREFILLED_SYRINGE | INTRAVENOUS | Status: AC
  Filled 2020-06-30: qty 20

## 2020-06-30 MED ORDER — LORAZEPAM 2 MG/ML IJ SOLN: 1.0000 mg | INTRAMUSCULAR | Status: DC | PRN

## 2020-06-30 MED ORDER — SODIUM BICARBONATE 8.4 % IV SOLN
50.0000 meq | Freq: Once | INTRAVENOUS | Status: AC
  Administered 2020-06-30: 02:00:00 50 meq via INTRAVENOUS
  Filled 2020-06-30: qty 50

## 2020-06-30 MED ORDER — ALBUMIN HUMAN 5 % IV SOLN
25.0000 g | Freq: Four times a day (QID) | INTRAVENOUS | Status: DC
  Administered 2020-06-30: 17:00:00 25 g via INTRAVENOUS
  Filled 2020-06-30: qty 500

## 2020-06-30 MED ORDER — SODIUM BICARBONATE 8.4 % IV SOLN
INTRAVENOUS | Status: AC
  Administered 2020-06-30: 20:00:00 50 meq
  Filled 2020-06-30: qty 50

## 2020-06-30 MED ORDER — FENTANYL CITRATE (PF) 100 MCG/2ML IJ SOLN: 50.0000 ug | INTRAMUSCULAR | Status: DC | PRN

## 2020-06-30 MED ORDER — SODIUM CHLORIDE 0.9 % IV SOLN
250.0000 mL | INTRAVENOUS | Status: DC
  Administered 2020-06-30 (×2): 250 mL via INTRAVENOUS

## 2020-06-30 MED ORDER — DEXMEDETOMIDINE HCL IN NACL 400 MCG/100ML IV SOLN: 0.4000 ug/kg/h | INTRAVENOUS | Status: DC

## 2020-06-30 MED ORDER — CHLORHEXIDINE GLUCONATE CLOTH 2 % EX PADS
6.0000 | MEDICATED_PAD | Freq: Every day | CUTANEOUS | Status: DC
  Administered 2020-06-30: 14:00:00 6 via TOPICAL

## 2020-06-30 MED ORDER — DEXMEDETOMIDINE HCL IN NACL 400 MCG/100ML IV SOLN
0.4000 ug/kg/h | INTRAVENOUS | Status: DC
  Administered 2020-06-30: 17:00:00 0.4 ug/kg/h via INTRAVENOUS
  Filled 2020-06-30: qty 100

## 2020-06-30 MED ORDER — ACETYLCYSTEINE LOAD VIA INFUSION
150.0000 mg/kg | Freq: Once | INTRAVENOUS | Status: AC
  Administered 2020-06-30: 16:00:00 11295 mg via INTRAVENOUS
  Filled 2020-06-30: qty 283

## 2020-06-30 MED ORDER — HEPARIN SODIUM (PORCINE) 5000 UNIT/ML IJ SOLN: 5000.0000 [IU] | Freq: Three times a day (TID) | INTRAMUSCULAR | Status: DC

## 2020-06-30 MED ORDER — CALCIUM GLUCONATE-NACL 2-0.675 GM/100ML-% IV SOLN
2.0000 g | Freq: Once | INTRAVENOUS | Status: AC
  Administered 2020-06-30: 16:00:00 2000 mg via INTRAVENOUS
  Filled 2020-06-30: qty 100

## 2020-06-30 MED ORDER — DEXTROSE 5 % IV SOLN
15.0000 mg/kg/h | INTRAVENOUS | Status: DC
  Administered 2020-06-30: 16:00:00 15 mg/kg/h via INTRAVENOUS
  Filled 2020-06-30 (×2): qty 120

## 2020-06-30 MED ORDER — EPINEPHRINE HCL 5 MG/250ML IV SOLN IN NS
0.5000 ug/min | INTRAVENOUS | Status: DC
  Administered 2020-06-30: 0.5 ug/min via INTRAVENOUS
  Filled 2020-06-30: qty 250

## 2020-06-30 MED ORDER — SODIUM BICARBONATE 8.4 % IV SOLN
50.0000 meq | Freq: Once | INTRAVENOUS | Status: AC
  Administered 2020-06-30: 14:00:00 50 meq via INTRAVENOUS

## 2020-06-30 MED ORDER — KETAMINE HCL-SODIUM CHLORIDE 100-0.9 MG/10ML-% IV SOSY
100.0000 mg | PREFILLED_SYRINGE | Freq: Once | INTRAVENOUS | Status: AC
  Administered 2020-06-30: 15:00:00 100 mg via INTRAVENOUS
  Filled 2020-06-30 (×2): qty 10

## 2020-06-30 MED ORDER — PIPERACILLIN-TAZOBACTAM 3.375 G IVPB 30 MIN
3.3750 g | Freq: Four times a day (QID) | INTRAVENOUS | Status: DC
  Administered 2020-06-30: 18:00:00 3.375 g via INTRAVENOUS
  Filled 2020-06-30 (×4): qty 50

## 2020-06-30 MED ORDER — DEXTROSE 50 % IV SOLN
INTRAVENOUS | Status: AC
  Filled 2020-06-30: qty 50

## 2020-06-30 MED ORDER — SODIUM BICARBONATE 8.4 % IV SOLN
INTRAVENOUS | Status: AC
  Administered 2020-06-30: 20:00:00 100 meq
  Filled 2020-06-30: qty 100

## 2020-06-30 MED ORDER — HEPARIN SODIUM (PORCINE) 1000 UNIT/ML DIALYSIS
1000.0000 [IU] | INTRAMUSCULAR | Status: DC | PRN
  Filled 2020-06-30: qty 6

## 2020-06-30 MED ORDER — HYDROCORTISONE NA SUCCINATE PF 100 MG IJ SOLR
50.0000 mg | Freq: Four times a day (QID) | INTRAMUSCULAR | Status: DC
  Administered 2020-06-30: 17:00:00 50 mg via INTRAVENOUS
  Filled 2020-06-30: qty 2

## 2020-06-30 MED ORDER — SODIUM BICARBONATE 8.4 % IV SOLN
INTRAVENOUS | Status: AC
  Filled 2020-06-30: qty 150

## 2020-07-01 LAB — URINE CULTURE: Culture: <10000 — AB

## 2020-07-01 LAB — CALCIUM, IONIZED

## 2020-07-07 MED FILL — Medication: Qty: 1 | Status: AC

## 2020-07-11 NOTE — Consult Note (Signed)
patient factors. Weight change:   Intake/Output Summary (Last 24 hours) at 06/21/2020 1515 Last data filed at 07/09/2020 1056 Gross per 24 hour  Intake 1200 ml  Output --  Net 1200 ml   BP (!) 93/59   Pulse 92   Temp (!) 92 F (33.3 C) (Rectal)   Resp (!) 24   Ht  (1.702 m)   Wt 81.6 kg   SpO2 (!) 86%   BMI 28.18 kg/m  Vitals:   07/06/2020 1123 07/08/2020 1142 06/24/2020 1215 06/21/2020 1230  BP: (!) 103/59 (!) 97/57 100/65 (!) 93/59  Pulse: 95 92 93 92  Resp: (!) 27 (!) 27 (!) 27 (!) 24  Temp:      TempSrc:      SpO2: 91% 92% (!) 87% (!) 86%  Weight: 75.3 kg  81.6 kg   Height:    (1.702 m)      General appearance: intubated and sedated Head: Normocephalic, without obvious abnormality, atraumatic Eyes: +icterus and conjunctival edema Resp: clear to auscultation bilaterally Cardio: regular rate and rhythm, S1, S2 normal, no murmur, click, rub or gallop GI: soft, non-tender; bowel sounds normal; no masses,  no organomegaly Extremities: no edema some mottling of toes  Labs: Basic Metabolic Panel: Recent Labs  Lab 2020-07-15 0850 07/04/2020 0127 06/13/2020 0133 06/11/2020 0349 06/12/2020 1053 07/04/2020 1118  NA 130* 125* 126* 128* 127* 126*  K 3.1* 4.3 4.4 7.1*  4.3 4.4  CL 89*  --   --  89* 92*  --   CO2 13*  --   --  RESULTS UNAVAILABLE DUE TO INTERFERING SUBSTANCE <7*  --   GLUCOSE 96  --   --  54* 84  --   BUN 12  --   --  9 11  --   CREATININE 1.61*  --   --  1.88* 2.22*  --   ALBUMIN 3.7  --   --  2.9* 2.8*  --   CALCIUM 9.1  --   --  7.7* 7.5*  --   PHOS  --   --   --  8.7*  --   --    Liver Function Tests: Recent Labs  Lab 2020/07/15 0850 07/06/2020 0349 07/05/2020 1053  AST 2,072* RESULTS UNAVAILABLE DUE TO INTERFERING SUBSTANCE >10,000*  ALT 811* 1,551* 1,936*  ALKPHOS 99 176* 188*  BILITOT 4.7* 5.5* 5.0*  PROT 7.1 5.5* 5.3*  ALBUMIN 3.7 2.9* 2.8*   Recent Labs  Lab 07-15-2020 0850  LIPASE 1,135*   No results for input([place]): AMMONIA in the last 168 hours. CBC: Recent Labs  Lab 2020-07-15 0850 07/09/2020 0127 07/05/2020 0133 07/07/2020 0209 07/06/2020 1118  WBC 11.7*  --   --  14.9*  --   HGB 13.5 14.3 14.3 12.5 13.9  HCT 41.7 42.0 42.0 40.8 41.0  MCV 97.9  --   --  107.4*  --   PLT 329  --   --  240  --    PT/INR: (inr:5) Cardiac Enzymes: )No results for input([place]): CKTOTAL, CKMB, CKMBINDEX, TROPONINI in the last 168 hours. CBG: Recent Labs  Lab 07/03/2020 0304 07/07/2020 0343 06/15/2020 0717 06/10/2020 1212  GLUCAP <10* 147* 109* 69*    Iron Studies: No results for input([place]): IRON, TIBC, TRANSFERRIN, FERRITIN in the last 168 hours.  Xrays/Other Studies: DG Chest 1 View  Addendum Date: 06/22/2020   ADDENDUM REPORT: 07/10/2020 15:05 ADDENDUM: A left IJ line is also in place. The tip is in the mid right  patient factors. Weight change:   Intake/Output Summary (Last 24 hours) at 06/21/2020 1515 Last data filed at 07/09/2020 1056 Gross per 24 hour  Intake 1200 ml  Output --  Net 1200 ml   BP (!) 93/59   Pulse 92   Temp (!) 92 F (33.3 C) (Rectal)   Resp (!) 24   Ht  (1.702 m)   Wt 81.6 kg   SpO2 (!) 86%   BMI 28.18 kg/m  Vitals:   07/06/2020 1123 07/08/2020 1142 06/24/2020 1215 06/21/2020 1230  BP: (!) 103/59 (!) 97/57 100/65 (!) 93/59  Pulse: 95 92 93 92  Resp: (!) 27 (!) 27 (!) 27 (!) 24  Temp:      TempSrc:      SpO2: 91% 92% (!) 87% (!) 86%  Weight: 75.3 kg  81.6 kg   Height:    (1.702 m)      General appearance: intubated and sedated Head: Normocephalic, without obvious abnormality, atraumatic Eyes: +icterus and conjunctival edema Resp: clear to auscultation bilaterally Cardio: regular rate and rhythm, S1, S2 normal, no murmur, click, rub or gallop GI: soft, non-tender; bowel sounds normal; no masses,  no organomegaly Extremities: no edema some mottling of toes  Labs: Basic Metabolic Panel: Recent Labs  Lab 2020-07-15 0850 07/04/2020 0127 06/13/2020 0133 06/11/2020 0349 06/12/2020 1053 07/04/2020 1118  NA 130* 125* 126* 128* 127* 126*  K 3.1* 4.3 4.4 7.1*  4.3 4.4  CL 89*  --   --  89* 92*  --   CO2 13*  --   --  RESULTS UNAVAILABLE DUE TO INTERFERING SUBSTANCE <7*  --   GLUCOSE 96  --   --  54* 84  --   BUN 12  --   --  9 11  --   CREATININE 1.61*  --   --  1.88* 2.22*  --   ALBUMIN 3.7  --   --  2.9* 2.8*  --   CALCIUM 9.1  --   --  7.7* 7.5*  --   PHOS  --   --   --  8.7*  --   --    Liver Function Tests: Recent Labs  Lab 2020/07/15 0850 07/06/2020 0349 07/05/2020 1053  AST 2,072* RESULTS UNAVAILABLE DUE TO INTERFERING SUBSTANCE >10,000*  ALT 811* 1,551* 1,936*  ALKPHOS 99 176* 188*  BILITOT 4.7* 5.5* 5.0*  PROT 7.1 5.5* 5.3*  ALBUMIN 3.7 2.9* 2.8*   Recent Labs  Lab 07-15-2020 0850  LIPASE 1,135*   No results for input([place]): AMMONIA in the last 168 hours. CBC: Recent Labs  Lab 2020-07-15 0850 07/09/2020 0127 07/05/2020 0133 07/07/2020 0209 07/06/2020 1118  WBC 11.7*  --   --  14.9*  --   HGB 13.5 14.3 14.3 12.5 13.9  HCT 41.7 42.0 42.0 40.8 41.0  MCV 97.9  --   --  107.4*  --   PLT 329  --   --  240  --    PT/INR: (inr:5) Cardiac Enzymes: )No results for input([place]): CKTOTAL, CKMB, CKMBINDEX, TROPONINI in the last 168 hours. CBG: Recent Labs  Lab 07/03/2020 0304 07/07/2020 0343 06/15/2020 0717 06/10/2020 1212  GLUCAP <10* 147* 109* 69*    Iron Studies: No results for input([place]): IRON, TIBC, TRANSFERRIN, FERRITIN in the last 168 hours.  Xrays/Other Studies: DG Chest 1 View  Addendum Date: 06/22/2020   ADDENDUM REPORT: 07/10/2020 15:05 ADDENDUM: A left IJ line is also in place. The tip is in the mid right  patient factors. Weight change:   Intake/Output Summary (Last 24 hours) at 06/21/2020 1515 Last data filed at 07/09/2020 1056 Gross per 24 hour  Intake 1200 ml  Output --  Net 1200 ml   BP (!) 93/59   Pulse 92   Temp (!) 92 F (33.3 C) (Rectal)   Resp (!) 24   Ht  (1.702 m)   Wt 81.6 kg   SpO2 (!) 86%   BMI 28.18 kg/m  Vitals:   07/06/2020 1123 07/08/2020 1142 06/24/2020 1215 06/21/2020 1230  BP: (!) 103/59 (!) 97/57 100/65 (!) 93/59  Pulse: 95 92 93 92  Resp: (!) 27 (!) 27 (!) 27 (!) 24  Temp:      TempSrc:      SpO2: 91% 92% (!) 87% (!) 86%  Weight: 75.3 kg  81.6 kg   Height:    (1.702 m)      General appearance: intubated and sedated Head: Normocephalic, without obvious abnormality, atraumatic Eyes: +icterus and conjunctival edema Resp: clear to auscultation bilaterally Cardio: regular rate and rhythm, S1, S2 normal, no murmur, click, rub or gallop GI: soft, non-tender; bowel sounds normal; no masses,  no organomegaly Extremities: no edema some mottling of toes  Labs: Basic Metabolic Panel: Recent Labs  Lab 2020-07-15 0850 07/04/2020 0127 06/13/2020 0133 06/11/2020 0349 06/12/2020 1053 07/04/2020 1118  NA 130* 125* 126* 128* 127* 126*  K 3.1* 4.3 4.4 7.1*  4.3 4.4  CL 89*  --   --  89* 92*  --   CO2 13*  --   --  RESULTS UNAVAILABLE DUE TO INTERFERING SUBSTANCE <7*  --   GLUCOSE 96  --   --  54* 84  --   BUN 12  --   --  9 11  --   CREATININE 1.61*  --   --  1.88* 2.22*  --   ALBUMIN 3.7  --   --  2.9* 2.8*  --   CALCIUM 9.1  --   --  7.7* 7.5*  --   PHOS  --   --   --  8.7*  --   --    Liver Function Tests: Recent Labs  Lab 2020/07/15 0850 07/06/2020 0349 07/05/2020 1053  AST 2,072* RESULTS UNAVAILABLE DUE TO INTERFERING SUBSTANCE >10,000*  ALT 811* 1,551* 1,936*  ALKPHOS 99 176* 188*  BILITOT 4.7* 5.5* 5.0*  PROT 7.1 5.5* 5.3*  ALBUMIN 3.7 2.9* 2.8*   Recent Labs  Lab 07-15-2020 0850  LIPASE 1,135*   No results for input([place]): AMMONIA in the last 168 hours. CBC: Recent Labs  Lab 2020-07-15 0850 07/09/2020 0127 07/05/2020 0133 07/07/2020 0209 07/06/2020 1118  WBC 11.7*  --   --  14.9*  --   HGB 13.5 14.3 14.3 12.5 13.9  HCT 41.7 42.0 42.0 40.8 41.0  MCV 97.9  --   --  107.4*  --   PLT 329  --   --  240  --    PT/INR: (inr:5) Cardiac Enzymes: )No results for input([place]): CKTOTAL, CKMB, CKMBINDEX, TROPONINI in the last 168 hours. CBG: Recent Labs  Lab 07/03/2020 0304 07/07/2020 0343 06/15/2020 0717 06/10/2020 1212  GLUCAP <10* 147* 109* 69*    Iron Studies: No results for input([place]): IRON, TIBC, TRANSFERRIN, FERRITIN in the last 168 hours.  Xrays/Other Studies: DG Chest 1 View  Addendum Date: 06/22/2020   ADDENDUM REPORT: 07/10/2020 15:05 ADDENDUM: A left IJ line is also in place. The tip is in the mid right  Rebecca S, PA  rosuvastatin (CRESTOR) 10 MG tablet Take 1 tablet (10 mg total) by mouth daily. 02/20/20  Yes Arvilla Market, DO  valsartan-hydrochlorothiazide (DIOVAN-HCT) 320-25 MG tablet Take 1 tablet by mouth daily. 03/20/20  Yes Arvilla Market, DO  cetirizine-pseudoephedrine (ZYRTEC-D) 5-120 MG tablet Take 1 tablet by mouth daily. Patient not taking: Reported on 07-24-2020 04/17/20   Moshe Cipro, NP  fluticasone [facility]) 50 MCG/ACT nasal spray Place 2 sprays into both nostrils daily. Patient not taking: Reported on Jul 24, 2020 04/17/20   Moshe Cipro, NP  ondansetron  (ZOFRAN ODT) 4 MG disintegrating tablet Take 1 tablet (4 mg total) by mouth every 8 (eight) hours as needed for nausea or vomiting. Jul 24, 2020   Gwyneth Sprout, MD  oxyCODONE (ROXICODONE) 5 MG immediate release tablet Take 1 tablet (5 mg total) by mouth every 4 (four) hours as needed for severe pain. 07-24-2020   Gwyneth Sprout, MD  oxyCODONE-acetaminophen (PERCOCET) 5-325 MG tablet Take 1 tablet by mouth every 6 (six) hours as needed. 04/25/20   Gailen Shelter, PA    Inpatient medications: . acetylcysteine  150 mg/kg Intravenous Once  . Chlorhexidine Gluconate Cloth  6 each Topical Daily  . etomidate      . folic acid  1 mg Intravenous Daily  . hydrocortisone sod succinate (SOLU-CORTEF) inj  50 mg Intravenous Q6H  . ketamine (KETALAR) injection 10mg /mL (IV use)  100 mg Intravenous Once  . nicotine  21 mg Transdermal Daily  . pantoprazole (PROTONIX) IV  40 mg Intravenous Q24H  . phytonadione  10 mg Oral Once  . rocuronium bromide      . sodium bicarbonate  50 mEq Intravenous Once  . sodium chloride flush  3 mL Intravenous Q12H  . thiamine  100 mg Intravenous Daily    Discontinued Meds:   Medications Discontinued During This Encounter  Medication Reason  . ondansetron (ZOFRAN-ODT) 4 MG disintegrating tablet Completed Course  . lactated ringers infusion   . lactated ringers infusion   . One-A-Day Womens 50 Plus TABS   . fentaNYL (SUBLIMAZE) injection 50 mcg   . gabapentin (NEURONTIN) capsule 600 mg   . enoxaparin (LOVENOX) injection 40 mg   . methylPREDNISolone sodium succinate (SOLU-MEDROL) 40 mg/mL injection 32 mg   . heparin injection 5,000 Units   . phenylephrine (NEOSYNEPHRINE) 10-0.9 MG/250ML-% infusion   . aspirin EC tablet 81 mg   . pantoprazole (PROTONIX) EC tablet 40 mg   . cholecalciferol (VITAMIN D3) tablet 1,000 Units   . omega-3 acid ethyl esters (LOVAZA) capsule 1 g   . ondansetron (ZOFRAN) tablet 4 mg   . multivitamin with minerals tablet 1 tablet   .  LORazepam (ATIVAN) tablet 1-4 mg   . LORazepam (ATIVAN) injection 1-4 mg   . thiamine tablet 100 mg   . folic acid (FOLVITE) tablet 1 mg   . LORazepam (ATIVAN) injection 0-4 mg   . LORazepam (ATIVAN) injection 0-4 mg   . fentaNYL (SUBLIMAZE) injection 25 mcg   . dexmedetomidine (PRECEDEX) 400 MCG/100ML (4 mcg/mL) infusion     Social History:  reports that she has been smoking. She has never used smokeless tobacco. She reports current alcohol use. She reports previous drug use.  Family History:   Family History  Problem Relation Age of Onset  . Hypertension Mother   . Heart disease Mother   . Hypertension Father   . Hypertension Sister   . Hypertension Sister     Review of systems not obtained due to  Rebecca S, PA  rosuvastatin (CRESTOR) 10 MG tablet Take 1 tablet (10 mg total) by mouth daily. 02/20/20  Yes Arvilla Market, DO  valsartan-hydrochlorothiazide (DIOVAN-HCT) 320-25 MG tablet Take 1 tablet by mouth daily. 03/20/20  Yes Arvilla Market, DO  cetirizine-pseudoephedrine (ZYRTEC-D) 5-120 MG tablet Take 1 tablet by mouth daily. Patient not taking: Reported on 07-24-2020 04/17/20   Moshe Cipro, NP  fluticasone [facility]) 50 MCG/ACT nasal spray Place 2 sprays into both nostrils daily. Patient not taking: Reported on Jul 24, 2020 04/17/20   Moshe Cipro, NP  ondansetron  (ZOFRAN ODT) 4 MG disintegrating tablet Take 1 tablet (4 mg total) by mouth every 8 (eight) hours as needed for nausea or vomiting. Jul 24, 2020   Gwyneth Sprout, MD  oxyCODONE (ROXICODONE) 5 MG immediate release tablet Take 1 tablet (5 mg total) by mouth every 4 (four) hours as needed for severe pain. 07-24-2020   Gwyneth Sprout, MD  oxyCODONE-acetaminophen (PERCOCET) 5-325 MG tablet Take 1 tablet by mouth every 6 (six) hours as needed. 04/25/20   Gailen Shelter, PA    Inpatient medications: . acetylcysteine  150 mg/kg Intravenous Once  . Chlorhexidine Gluconate Cloth  6 each Topical Daily  . etomidate      . folic acid  1 mg Intravenous Daily  . hydrocortisone sod succinate (SOLU-CORTEF) inj  50 mg Intravenous Q6H  . ketamine (KETALAR) injection 10mg /mL (IV use)  100 mg Intravenous Once  . nicotine  21 mg Transdermal Daily  . pantoprazole (PROTONIX) IV  40 mg Intravenous Q24H  . phytonadione  10 mg Oral Once  . rocuronium bromide      . sodium bicarbonate  50 mEq Intravenous Once  . sodium chloride flush  3 mL Intravenous Q12H  . thiamine  100 mg Intravenous Daily    Discontinued Meds:   Medications Discontinued During This Encounter  Medication Reason  . ondansetron (ZOFRAN-ODT) 4 MG disintegrating tablet Completed Course  . lactated ringers infusion   . lactated ringers infusion   . One-A-Day Womens 50 Plus TABS   . fentaNYL (SUBLIMAZE) injection 50 mcg   . gabapentin (NEURONTIN) capsule 600 mg   . enoxaparin (LOVENOX) injection 40 mg   . methylPREDNISolone sodium succinate (SOLU-MEDROL) 40 mg/mL injection 32 mg   . heparin injection 5,000 Units   . phenylephrine (NEOSYNEPHRINE) 10-0.9 MG/250ML-% infusion   . aspirin EC tablet 81 mg   . pantoprazole (PROTONIX) EC tablet 40 mg   . cholecalciferol (VITAMIN D3) tablet 1,000 Units   . omega-3 acid ethyl esters (LOVAZA) capsule 1 g   . ondansetron (ZOFRAN) tablet 4 mg   . multivitamin with minerals tablet 1 tablet   .  LORazepam (ATIVAN) tablet 1-4 mg   . LORazepam (ATIVAN) injection 1-4 mg   . thiamine tablet 100 mg   . folic acid (FOLVITE) tablet 1 mg   . LORazepam (ATIVAN) injection 0-4 mg   . LORazepam (ATIVAN) injection 0-4 mg   . fentaNYL (SUBLIMAZE) injection 25 mcg   . dexmedetomidine (PRECEDEX) 400 MCG/100ML (4 mcg/mL) infusion     Social History:  reports that she has been smoking. She has never used smokeless tobacco. She reports current alcohol use. She reports previous drug use.  Family History:   Family History  Problem Relation Age of Onset  . Hypertension Mother   . Heart disease Mother   . Hypertension Father   . Hypertension Sister   . Hypertension Sister     Review of systems not obtained due to

## 2020-07-11 NOTE — ED Notes (Addendum)
Pt transferred into a hospital bed. Fresh blankets facilitated. Critical Care was at bedside and spoke with pt about further plan of care. Pt currently. BP within normal limits at this time. Will continue to monitor.

## 2020-07-11 NOTE — Procedures (Signed)
Central Venous Catheter Insertion Procedure Note  Rebecca Oconnor  [id number]  1966/01/29  Date:06/28/2020  Time:2:28 PM   Provider Performing:Lashonne Shull   Procedure: Insertion of Non-tunneled Central Venous [phone]) with US guidance ([id number])   Indication(s) Medication administration  Consent Risks of the procedure as well as the alternatives and risks of each were explained to the patient and/or caregiver.  Consent for the procedure was obtained and is signed in the bedside chart  Anesthesia Topical only with 1% lidocaine   Timeout Verified patient identification, verified procedure, site/side was marked, verified correct patient position, special equipment/implants available, medications/allergies/relevant history reviewed, required imaging and test results available.  Sterile Technique Maximal sterile technique including full sterile barrier drape, hand hygiene, sterile gown, sterile gloves, mask, hair covering, sterile ultrasound probe cover (if used).  Procedure Description Area of catheter insertion was cleaned with chlorhexidine and draped in sterile fashion.  With real-time ultrasound guidance a central venous catheter was placed into the left internal jugular vein. Nonpulsatile blood flow and easy flushing noted in all ports.  The catheter was sutured in place and sterile dressing applied.  Complications/Tolerance None; patient tolerated the procedure well. Chest X-ray is ordered to verify placement for internal jugular or subclavian cannulation.   Chest x-ray is not ordered for femoral cannulation.  EBL Minimal  Specimen(s) None

## 2020-07-11 NOTE — ED Notes (Signed)
Critical potassium value given to primary RN, Miki Kins

## 2020-07-11 NOTE — ED Notes (Signed)
CCM at bedside 

## 2020-07-11 NOTE — ED Notes (Signed)
CBG results of  "LO <10". Emergency AMP D50 given. Vernona Rieger, PA made aware, regarding both pt's CBG and continuous Hypotension. Manual BP Systolic 70.  Pt mentating well, A/O x4. Brynda Rim, PA  titrate LEVOPHED to .

## 2020-07-11 NOTE — Progress Notes (Addendum)
MEDICATION RELATED CONSULT NOTE - INITIAL   Pharmacy Consult for NAC Indication: acetaminophen overdose   No Known Allergies  Patient Measurements:   Adjusted Body Weight:   Vital Signs: Temp: 92 F (33.3 C) (12/21 1028) Temp Source: Rectal (12/21 1028) BP: 91/56 (12/21 1106) Pulse Rate: 93 (12/21 1106) Intake/Output from previous day: No intake/output data recorded. Intake/Output from this shift: Total I/O In: 1200 [I.V.:1200] Out: -   Labs: Recent Labs    06/25/2020 0850 July 09, 2020 0127 07-09-20 0133 07-09-2020 0209  WBC 11.7*  --   --  14.9*  HGB 13.5 14.3 14.3 12.5  HCT 41.7 42.0 42.0 40.8  PLT 329  --   --  240  CREATININE 1.61*  --   --   --   ALBUMIN 3.7  --   --   --   PROT 7.1  --   --   --   AST 2,072*  --   --   --   ALT 811*  --   --   --   ALKPHOS 99  --   --   --   BILITOT 4.7*  --   --   --    CrCl cannot be calculated (Unknown ideal weight.).   Microbiology: Recent Results (from the past 720 hour(s))  Resp Panel by RT-PCR (Flu A&B, Covid) Nasopharyngeal Swab     Status: None   Collection Time: 07-09-2020  2:09 AM   Specimen: Nasopharyngeal Swab; Nasopharyngeal(NP) swabs in vial transport medium  Result Value Ref Range Status   SARS Coronavirus 2 by RT PCR NEGATIVE NEGATIVE Final    Comment: (NOTE) SARS-CoV-2 target nucleic acids are NOT DETECTED.  The SARS-CoV-2 RNA is generally detectable in upper respiratory specimens during the acute phase of infection. The lowest concentration of SARS-CoV-2 viral copies this assay can detect is 138 copies/mL. A negative result does not preclude SARS-Cov-2 infection and should not be used as the sole basis for treatment or other patient management decisions. A negative result may occur with  improper specimen collection/handling, submission of specimen other than nasopharyngeal swab, presence of viral mutation(s) within the areas targeted by this assay, and inadequate number of viral copies(<138 copies/mL).  A negative result must be combined with clinical observations, patient history, and epidemiological information. The expected result is Negative.  Fact Sheet for Patients:  BloggerCourse.com  Fact Sheet for Healthcare Providers:  SeriousBroker.it  This test is no t yet approved or cleared by the [place] FDA and  has been authorized for detection and/or diagnosis of SARS-CoV-2 by FDA under an Emergency Use Authorization (EUA). This EUA will remain  in effect (meaning this test can be used) for the duration of the COVID-19 declaration under Section 564(b)(1) of the Act, 21 U.S.C.section 360bbb-3(b)(1), unless the authorization is terminated  or revoked sooner.       Influenza A by PCR NEGATIVE NEGATIVE Final   Influenza B by PCR NEGATIVE NEGATIVE Final    Comment: (NOTE) The Xpert Xpress SARS-CoV-2/FLU/RSV plus assay is intended as an aid in the diagnosis of influenza from Nasopharyngeal swab specimens and should not be used as a sole basis for treatment. Nasal washings and aspirates are unacceptable for Xpert Xpress SARS-CoV-2/FLU/RSV testing.  Fact Sheet for Patients: BloggerCourse.com  Fact Sheet for Healthcare Providers: SeriousBroker.it  This test is not yet approved or cleared by the [place] FDA and has been authorized for detection and/or diagnosis of SARS-CoV-2 by FDA under an Emergency Use Authorization (EUA). This EUA  will remain in effect (meaning this test can be used) for the duration of the COVID-19 declaration under Section 564(b)(1) of the Act, 21 U.S.C. section 360bbb-3(b)(1), unless the authorization is terminated or revoked.  Performed at [facility], [address], [place], [place] [address]     Medical History: Past Medical History:  Diagnosis Date  . 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 Hypertension   . Peripheral neuropathy 11/22/2018  . Right carpal tunnel syndrome 11/22/2018  . STD (sexually transmitted disease)    trichomonas treated 06/2019 & 07/2019    Assessment: 55 yo female presented with SOB and abdominal pain. Found to have elevated acetaminophen level of 195. Pharmacy consulted to dose NAC for acetaminophen overdose with assistance of poison control. Patient reported taking 5 tablets of acetaminophen 500mg  every 3 hours starting on 06/27/2020 until ED presentation on 06/17/2020 AM.    Plan:  Fomepizole 15 mg/kg x1  NAC 150mg /kg x1 load followed by NAC 15mg /kg/hr Vitamin K 10mg  po x 1 Obtain CMP, INR, APAP level 22 hours after start of infusion  Salicyclic acid level and magnesium at 0200  07/01/2020, PharmD Clinical Pharmacist  2020/07/02,11:20 AM

## 2020-07-11 NOTE — Progress Notes (Addendum)
PCCM interval progress note:   Pt steadily declining despite maximal support secondary to multi-organ failure.  I spoke with niece who will arrive within the hour and would like to see patient before she passes.  Add epinephrine gtt.  Will encourage comfort care when family arrives.     Pt's niece arrived at the bedside and transitioned to comfort care, pt passed peacefully at 2049.      Darcella Gasman Raimi Guillermo, PA-C

## 2020-07-11 NOTE — H&P (Signed)
NAME:  Rebecca Oconnor, MRN:  [id number], DOB:  [birth year], LOS: 1 ADMISSION DATE:  06/22/2020, CONSULTATION DATE:  07/10/2020 REFERRING MD:  EDP, CHIEF COMPLAINT:  pancreatitis   Brief History:  55 y.o. F with PMH of HTN, ETOH abuse, recent covid-19 infection (04/2020) and pancreatitis who presented to the ED with shortness of breath and abdominal pain and is found to have acute pancreatitis with worsening hypotension.  History of Present Illness:  Rebecca Oconnor is a 55 y.o. F with PMH  HTN, ETOH abuse, recent covid-19 infection (04/2020) and pancreatitis who presented to the ED with shortness of breath and abdominal pain.  She reports baseline wine intake of 4-5 glasses daily, but about three days ago drank approximately half a gallon of ETOH.   She has had several episodes of vomiting with epigastric pain, but denies fever, diarrhea or melena.   In the ED, CT abd/pelvis with peripancreatic inflammatory changes with diffuse fatty infiltration of the liver and distended GB, Lactic acid >11, INR 4.0, elevated LFT's.  She received approximately 4L IVF with continued hypotension, so Neosynephrine started and PCCM consulted for admission  Past Medical History:   has a past medical history of HSV-2 (herpes simplex virus 2) infection, Hypertension, Peripheral neuropathy (11/22/2018), Right carpal tunnel syndrome (11/22/2018), and STD (sexually transmitted disease).   Significant Hospital Events:  12/21 Admit  Consults:  PCCM  Procedures:    Significant Diagnostic Tests:   12/20 CT abdomen/pelvis>>Changes of pancreatitis with peripancreatic inflammatory change which extends inferiorly as well as anteriorly causing some compensatory changes in the transverse colon. No pseudocyst or pancreatic necrosis is noted at this time.  Right-sided pleural effusion and mild right middle and lower lobe atelectatic changes.  Hypodense lesion in the left adrenal better visualized on the current exam  consistent with a small adenoma.  Stable hypodense lesions within the liver. Again, nonemergent MRI would be helpful for characterization  Micro Data:  12/20 Covid-19, flu>>pending  Antimicrobials:     Interim History / Subjective:  Pt increasingly restless and asking to leave  Objective   Blood pressure (!) 90/58, pulse 72, temperature 97.8 F (36.6 C), temperature source Oral, resp. rate 19, SpO2 97 %.       No intake or output data in the 24 hours ending 07/10/20 0036 There were no vitals filed for this visit.  General:  Well-nourished F, restless, but not in severe distress HEENT: MM pink/moist Neuro: awake and alert, following commands, perseverating on eating, but oriented x3 CV: s1s2 rrr, no m/r/g PULM:  Clear bilaterally GI: soft, epigastric abdominal pain without rebound tenderness, bsx4 active  Extremities: warm/dry, no edema  Skin: no rashes or lesions   Resolved Hospital Problem list     Assessment & Plan:     Severe, acute pancreatitis  Significantly elevated Lipase and inflammation on CT without evidence of necrosis or abscess, worsening hypotension in the ED requiring peripheral pressors.  Likely alcohol induced P: -Admit to ICU for supportive care, continue IVF @250cc /hr, strict NPO -Continue peripheral Levophed to maintain MAP >65 -Correct electrolytes as needed  -check triglycerides     AGMA Likely secondary to severe lactic acidosis P: -Continue IVF and supportive care, monitor serial metabolic panel -follow lactate   Alcohol Abuse 4-5 glasses of wine at baseline, unsure when last drink was P: -continue CIWA and monitor for DT's -thiamine and MV     Renal insufficiency, elevated LFT's Creatinine intermittently elevated over last admissions, currently making urine.  Has baseline  elevated transaminases, however significantly worsened on admission P: -acutely worsening transaminases likely secondary to alcoholic hepatitis, but  reports 2000mg  Tylenol q4hrs for the last several days, so check tylenol level and acute hepatitis panel -Continue supportive care, monitor renal indices and UOP and avoid nephrotoxins as able -Monitor INR, may need Vit K   HTN, HL -hold home Norvasc, Diovan, Crestor in the setting of the above      Best practice (evaluated daily)  Diet: NPO Pain/Anxiety/Delirium protocol (if indicated): Fentanyl, CIWA VAP protocol (if indicated): n/a DVT prophylaxis: SCD"s GI prophylaxis: protonix Glucose control: POC glucose checks Mobility: bed rest Disposition:ICU  Goals of Care:  Last date of multidisciplinary goals of care discussion: Family and staff present:  Summary of discussion:  Follow up goals of care discussion due:  Code Status: full code  Labs   CBC: Recent Labs  Lab July 08, 2020 0850  WBC 11.7*  HGB 13.5  HCT 41.7  MCV 97.9  PLT 329    Basic Metabolic Panel: Recent Labs  Lab 07/08/2020 0850  NA 130*  K 3.1*  CL 89*  CO2 13*  GLUCOSE 96  BUN 12  CREATININE 1.61*  CALCIUM 9.1   GFR: CrCl cannot be calculated (Unknown ideal weight.). Recent Labs  Lab 07/08/2020 0845 07-08-2020 0850 07-08-20 1232 July 08, 2020 2248  WBC  --  11.7*  --   --   LATICACIDVEN >11.0*  --  >11.0* >11.0*    Liver Function Tests: Recent Labs  Lab 07/08/20 0850  AST 2,072*  ALT 811*  ALKPHOS 99  BILITOT 4.7*  PROT 7.1  ALBUMIN 3.7   Recent Labs  Lab 08-Jul-2020 0850  LIPASE 1,135*   No results for input(s): AMMONIA in the last 168 hours.  ABG    Component Value Date/Time   TCO2 23 07/17/2018 1139     Coagulation Profile: Recent Labs  Lab Jul 08, 2020 1650  INR 4.0*    Cardiac Enzymes: No results for input(s): CKTOTAL, CKMB, CKMBINDEX, TROPONINI in the last 168 hours.  HbA1C: Hgb A1c MFr Bld  Date/Time Value Ref Range Status  08/08/2018 03:53 PM 5.1 4.8 - 5.6 % Final    Comment:             Prediabetes: 5.7 - 6.4          Diabetes: >6.4          Glycemic control  for adults with diabetes: <7.0     CBG: No results for input(s): GLUCAP in the last 168 hours.  Review of Systems:   Negative except as noted in HPI  Past Medical History:  She,  has a past medical history of HSV-2 (herpes simplex virus 2) infection, Hypertension, Peripheral neuropathy (11/22/2018), Right carpal tunnel syndrome (11/22/2018), and STD (sexually transmitted disease).   Surgical History:   Past Surgical History:  Procedure Laterality Date  . FINGER SURGERY     partial amputation     Social History:   reports that she has been smoking. She has never used smokeless tobacco. She reports current alcohol use. She reports previous drug use.   Family History:  Her family history includes Heart disease in her mother; Hypertension in her father, mother, sister, and sister.   Allergies No Known Allergies   Home Medications  Prior to Admission medications   Medication Sig Start Date End Date Taking? Authorizing Provider  amLODipine (NORVASC) 5 MG tablet Take 1 tablet (5 mg total) by mouth daily. 03/20/20  Yes Arvilla Market, DO  aspirin  EC 81 MG tablet Take 81 mg by mouth daily. Swallow whole.   Yes [provider]  Cholecalciferol (VITAMIN D-3) 25 MCG (1000 UT) CAPS Take 1,000 Units by mouth daily.   Yes [provider]  gabapentin (NEURONTIN) 600 MG tablet Take 1 tablet (600 mg total) by mouth 4 (four) times daily. 03/25/20  Yes Glean Salvo, NP  Multiple Vitamins-Minerals (ONE-A-DAY WOMENS 50 PLUS PO) Take 1 tablet by mouth daily.   Yes [provider]  Omega-3 Fatty Acids (FISH OIL) 1000 MG CAPS Take 1,000 mg by mouth daily.   Yes [provider]  omeprazole (PRILOSEC) 20 MG capsule Take 1 capsule (20 mg total) by mouth daily. 02/20/20  Yes Arvilla Market, DO  potassium chloride SA (KLOR-CON) 20 MEQ tablet Take 2 tablets (40 mEq total) by mouth daily. 04/25/20  Yes Fondaw, Wylder S, PA  rosuvastatin (CRESTOR) 10 MG  tablet Take 1 tablet (10 mg total) by mouth daily. 02/20/20  Yes Arvilla Market, DO  valsartan-hydrochlorothiazide (DIOVAN-HCT) 320-25 MG tablet Take 1 tablet by mouth daily. 03/20/20  Yes Arvilla Market, DO  cetirizine-pseudoephedrine (ZYRTEC-D) 5-120 MG tablet Take 1 tablet by mouth daily. Patient not taking: Reported on 25-Jul-2020 04/17/20   Moshe Cipro, NP  fluticasone [facility]) 50 MCG/ACT nasal spray Place 2 sprays into both nostrils daily. Patient not taking: Reported on Jul 25, 2020 04/17/20   Moshe Cipro, NP  ondansetron (ZOFRAN ODT) 4 MG disintegrating tablet Take 1 tablet (4 mg total) by mouth every 8 (eight) hours as needed for nausea or vomiting. 2020-07-25   Gwyneth Sprout, MD  oxyCODONE (ROXICODONE) 5 MG immediate release tablet Take 1 tablet (5 mg total) by mouth every 4 (four) hours as needed for severe pain. 07-25-20   Gwyneth Sprout, MD  oxyCODONE-acetaminophen (PERCOCET) 5-325 MG tablet Take 1 tablet by mouth every 6 (six) hours as needed. 04/25/20   Gailen Shelter, PA     Critical care time: 50 minutes      CRITICAL CARE Performed by: Darcella Gasman Geneva Barrero   Total critical care time: 50 minutes  Critical care time was exclusive of separately billable procedures and treating other patients.  Critical care was necessary to treat or prevent imminent or life-threatening deterioration.  Critical care was time spent personally by me on the following activities: development of treatment plan with patient and/or surrogate as well as nursing, discussions with consultants, evaluation of patient's response to treatment, examination of patient, obtaining history from patient or surrogate, ordering and performing treatments and interventions, ordering and review of laboratory studies, ordering and review of radiographic studies, pulse oximetry and re-evaluation of patient's condition.   Darcella Gasman Savita Runner, PA-C [facility]  Pager# [phone], if no  answer [phone]

## 2020-07-11 NOTE — Progress Notes (Signed)
RT obtained ABG on pt with the following results. Critical values sent to MD Nicole Cella. RT will continue to monitor.   Results for Rebecca Oconnor, Rebecca Oconnor (MRN [id number]) as of 07-20-20 01:40  Ref. Range 07/20/2020 01:33  Sample type Unknown ARTERIAL  pH, Arterial Latest Ref Range: 7.350 - 7.450  6.956 (LL)  pCO2 arterial Latest Ref Range: 32.0 - 48.0 mmHg 18.4 (LL)  pO2, Arterial Latest Ref Range: 83.0 - 108.0 mmHg 96  TCO2 Latest Ref Range: 22 - 32 mmol/L <5 (L)  Acid-base deficit Latest Ref Range: 0.0 - 2.0 mmol/L 27.0 (H)  Bicarbonate Latest Ref Range: 20.0 - 28.0 mmol/L 4.1 (L)  O2 Saturation Latest Units: % 92.0  Patient temperature Unknown 97.8 F  Collection site Unknown Radial

## 2020-07-11 NOTE — H&P (Addendum)
NAME:  Rebecca Oconnor, MRN:  [id number], DOB:  [birth year], LOS: 1 ADMISSION DATE:  07/02/20, CONSULTATION DATE:  06/10/2020 REFERRING MD:  EDP, CHIEF COMPLAINT:  pancreatitis   Brief History:  55 y.o. F with PMH of HTN, ETOH abuse, recent covid-19 infection (04/2020) and pancreatitis who presented to the ED with shortness of breath and abdominal pain and is found to have acute pancreatitis with worsening hypotension.  History of Present Illness:  Rebecca Oconnor is a 55 y.o. F with PMH  HTN, ETOH abuse, recent covid-19 infection (04/2020) and pancreatitis who presented to the ED with shortness of breath and abdominal pain.  She reports baseline wine intake of 4-5 glasses daily, but about three days ago drank approximately half a gallon of ETOH.   She has had several episodes of vomiting with epigastric pain, but denies fever, diarrhea or melena.   In the ED, CT abd/pelvis with peripancreatic inflammatory changes with diffuse fatty infiltration of the liver and distended GB, Lactic acid >11, INR 4.0, elevated LFT's.  She received approximately 4L IVF with continued hypotension, so Neosynephrine started and PCCM consulted for admission  Past Medical History:   has a past medical history of HSV-2 (herpes simplex virus 2) infection, Hypertension, Peripheral neuropathy (11/22/2018), Right carpal tunnel syndrome (11/22/2018), and STD (sexually transmitted disease).   Significant Hospital Events:  12/21 Admit  Consults:  PCCM  Procedures:    Significant Diagnostic Tests:   12/20 CT abdomen/pelvis>>Changes of pancreatitis with peripancreatic inflammatory change which extends inferiorly as well as anteriorly causing some compensatory changes in the transverse colon. No pseudocyst or pancreatic necrosis is noted at this time.  Right-sided pleural effusion and mild right middle and lower lobe atelectatic changes.  Hypodense lesion in the left adrenal better visualized on the current exam  consistent with a small adenoma.  Stable hypodense lesions within the liver. Again, nonemergent MRI would be helpful for characterization  Micro Data:  12/20 Covid-19, flu>> negative  Antimicrobials:     Interim History / Subjective:  Patient has threatened to leave AMA multiple times.  We will continue to treat her as we can she does appear to be improving  Objective   Blood pressure 105/66, pulse 90, temperature (!) 90.6 F (32.6 C), temperature source Rectal, resp. rate (!) 25, SpO2 92 %.       No intake or output data in the 24 hours ending 06/27/2020 0848 There were no vitals filed for this visit.  General: Awake alert no acute distress at rest remains on vasopressor support HEENT: No lymphadenopathy or JVD is appreciated Neuro: Grossly intact no focal defects. CV: Heart sounds are regular regular rate rhythm remains on peripheral Levophed, no active attempt to titrate down at this point. PULM: Diminished in the bases  GI: soft, bsx4 active, nontender Extremities: warm/dry, negative edema  Skin: no rashes or lesions   Intake/Output Summary (Last 24 hours) at 06/14/2020 0857 Last data filed at 06/23/2020 0865 Gross per 24 hour  Intake 200 ml  Output --  Net 200 ml    Resolved Hospital Problem list     Assessment & Plan:     Severe, acute pancreatitis  Significantly elevated Lipase and inflammation on CT without evidence of necrosis or abscess, worsening hypotension in the ED requiring peripheral pressors.  Likely alcohol induced P: To ICU Continue peripheral vasopressors Fluid resuscitation Monitor correct electrolytes      AGMA Likely secondary to severe lactic acidosis P: Continue fluid resuscitation Monitor lactic  Alcohol  Abuse 4-5 glasses of wine at baseline, unsure when last drink was P: Continue CIWA protocol monitor for delirium tremens Thiamine and folic acid      Renal insufficiency, elevated LFT's Lab Results  Component Value  Date   CREATININE 1.61 (H) 07/10/2020   CREATININE 1.58 (H) 04/30/2020   CREATININE 1.40 (H) 04/24/2020   Lab Results  Component Value Date   INR 4.0 (H) 06/16/2020   INR 1.1 04/24/2020   INR 1.0 07/02/2019     Creatinine intermittently elevated over last admissions, currently making urine.  Has baseline elevated transaminases, however significantly worsened on admission P: Monitor LFTs Fluid resuscitation Serial INR Monitor for bleeding Repeat LFTs   HTN, HL Holding antihypertensives due to shock state      Best practice (evaluated daily)  Diet: NPO Pain/Anxiety/Delirium protocol (if indicated): Fentanyl, CIWA VAP protocol (if indicated): n/a DVT prophylaxis: SCD"s GI prophylaxis: protonix Glucose control: POC glucose checks Mobility: bed rest Disposition:ICU  Goals of Care:  Last date of multidisciplinary goals of care discussion: Family and staff present:  Summary of discussion:  Follow up goals of care discussion due:  Code Status: full code  Labs   CBC: Recent Labs  Lab 06/12/2020 0850 07-16-2020 0127 07-16-20 0133 07/16/2020 0209  WBC 11.7*  --   --  14.9*  HGB 13.5 14.3 14.3 12.5  HCT 41.7 42.0 42.0 40.8  MCV 97.9  --   --  107.4*  PLT 329  --   --  240    Basic Metabolic Panel: Recent Labs  Lab 07/01/2020 0850 2020-07-16 0127 July 16, 2020 0133  NA 130* 125* 126*  K 3.1* 4.3 4.4  CL 89*  --   --   CO2 13*  --   --   GLUCOSE 96  --   --   BUN 12  --   --   CREATININE 1.61*  --   --   CALCIUM 9.1  --   --    GFR: CrCl cannot be calculated (Unknown ideal weight.). Recent Labs  Lab 06/16/2020 0845 06/22/2020 0850 06/16/2020 1232 06/18/2020 2248 July 16, 2020 0030 2020/07/16 0209  WBC  --  11.7*  --   --   --  14.9*  LATICACIDVEN >11.0*  --  >11.0* >11.0* >11.0*  --     Liver Function Tests: Recent Labs  Lab 07/02/2020 0850  AST 2,072*  ALT 811*  ALKPHOS 99  BILITOT 4.7*  PROT 7.1  ALBUMIN 3.7   Recent Labs  Lab 06/28/2020 0850  LIPASE 1,135*    No results for input(s): AMMONIA in the last 168 hours.  ABG    Component Value Date/Time   PHART 6.956 (LL) Jul 16, 2020 0133   PCO2ART 18.4 (LL) 16-Jul-2020 0133   PO2ART 96 Jul 16, 2020 0133   HCO3 4.1 (L) July 16, 2020 0133   TCO2 <5 (L) 07/16/2020 0133   ACIDBASEDEF 27.0 (H) 2020/07/16 0133   O2SAT 92.0 07/16/20 0133     Coagulation Profile: Recent Labs  Lab 07/08/2020 1650  INR 4.0*    Cardiac Enzymes: No results for input(s): CKTOTAL, CKMB, CKMBINDEX, TROPONINI in the last 168 hours.  HbA1C: Hgb A1c MFr Bld  Date/Time Value Ref Range Status  08/08/2018 03:53 PM 5.1 4.8 - 5.6 % Final    Comment:             Prediabetes: 5.7 - 6.4          Diabetes: >6.4          Glycemic control for adults  with diabetes: <7.0     CBG: Recent Labs  Lab 06/22/2020 0304 06/22/2020 0343 07/02/2020 0717  GLUCAP <10* 147* 109*      Critical care time: 30 minutes     Brett Canales Katja Blue ACNP Acute Care Nurse Practitioner [facility]/Critical Care Please consult Amion 07/03/2020, 8:49 AM

## 2020-07-11 NOTE — Procedures (Signed)
Cardiopulmonary Resuscitation Note  Rebecca Oconnor  [id number]  [birth year]  Date:07/04/2020  Time:6:56 PM   Provider Performing:Sharolyn Weber   Procedure: Cardiopulmonary Resuscitation [phone])  Indication(s) Loss of Pulse  Consent N/A  Anesthesia N/A   Time Out N/A   Sterile Technique Hand hygiene, gloves   Procedure Description Called to patient's room for CODE BLUE. Initial rhythm was PEA/Asystole. Patient received high quality chest compressions for 5 minutes with defibrillation or cardioversion when appropriate. Epinephrine was administered every 3 minutes as directed by time Biomedical engineer. Additional pharmacologic interventions included calcium chloride, phenylephrine and sodium bicarbonate. Additional procedural interventions include bedside echo.  Return of spontaneous circulation was achieved.  Family called and notified.   Complications/Tolerance N/A   EBL N/A   Specimen(s) N/A  Estimated time to ROSC: 5 minutes

## 2020-07-11 NOTE — Discharge Summary (Signed)
NAME:  Rebecca Oconnor, MRN:  [id number], DOB:  01/25/1966, LOS: 1 ADMISSION DATE:  07-20-2020, CONSULTATION DATE:  06/15/2020 REFERRING MD:  EDP, CHIEF COMPLAINT:  pancreatitis   Brief History:  55 y.o. F with PMH of HTN, ETOH abuse, recent covid-19 infection (04/2020) and pancreatitis who presented to the ED with shortness of breath and abdominal pain and is found to have acute pancreatitis with worsening hypotension.  History of Present Illness:  Rebecca Oconnor is a 55 y.o. F with PMH  HTN, ETOH abuse, recent covid-19 infection (04/2020) and pancreatitis who presented to the ED with shortness of breath and abdominal pain.  She reports baseline wine intake of 4-5 glasses daily, but about three days ago drank approximately half a gallon of ETOH.   She has had several episodes of vomiting with epigastric pain, but denies fever, diarrhea or melena.   In the ED, CT abd/pelvis with peripancreatic inflammatory changes with diffuse fatty infiltration of the liver and distended GB, Lactic acid >11, INR 4.0, elevated LFT's.  She received approximately 4L IVF with continued hypotension, so Neosynephrine started and PCCM consulted for admission.  Patient was admitted to intensive care and progressively worsened throughout the day requiring emergent CRRT and intubation.  Tylenol level returned markedly elevated at 195 and she developed fulminant liver failure with severe acidosis and shock.   She briefly coded and was subsequently unresponsive and hypotensive despite three vasopressors and bicarb pushes and drip.   Pt's niece arrived to the bedside and she was transitioned to comfort care.  Pt passed with family at the bedside at 2049 on 12/21.  Past Medical History:   has a past medical history of HSV-2 (herpes simplex virus 2) infection, Hypertension, Peripheral neuropathy (11/22/2018), Right carpal tunnel syndrome (11/22/2018), and STD (sexually transmitted disease).   Significant Hospital Events:  12/21  Admit 12/22 Code blue  Consults:  PCCM Nephrology  Procedures:  Intubation HD catheter Arterial line  Significant Diagnostic Tests:   12/20 CT abdomen/pelvis>>Changes of pancreatitis with peripancreatic inflammatory change which extends inferiorly as well as anteriorly causing some compensatory changes in the transverse colon. No pseudocyst or pancreatic necrosis is noted at this time.  Right-sided pleural effusion and mild right middle and lower lobe atelectatic changes.  Hypodense lesion in the left adrenal better visualized on the current exam consistent with a small adenoma.  Stable hypodense lesions within the liver. Again, nonemergent MRI would be helpful for characterization  Micro Data:  12/20 Covid-19, flu>>pending  Antimicrobials:   Zosyn 12/20  Interim History / Subjective:  Progressive multi-organ failure and shock secondary to Pancreatitis, Acetaminophen overdose with fulminant liver failure and acidosis.  Objective   Blood pressure (!) 78/49, pulse 91, temperature (!) 93.2 F (34 C), temperature source Axillary, resp. rate (!) 28, height 5\' 7"  (1.702 m), weight 81.6 kg, SpO2 (!) 50 %.    Vent Mode: PRVC FiO2 (%):  [80 %-100 %] 100 % Set Rate:  [30 bmp] 30 bmp Vt Set:  [370 mL] 370 mL PEEP:  [8 cmH20-12 cmH20] 12 cmH20 Plateau Pressure:  [18 cmH20-26 cmH20] 26 cmH20   Intake/Output Summary (Last 24 hours) at 06/27/2020 2119 Last data filed at 06/28/2020 1900 Gross per 24 hour  Intake 3881.38 ml  Output 39 ml  Net 3842.38 ml   Filed Weights   06/20/2020 1123 06/18/2020 1215  Weight: 75.3 kg 81.6 kg    General:  Unresponsive F, intubated on full vent support HEENT: MM pink/moist, jaundiced sclera Neuro: unresponsive,  not triggering vent, pupils dilated and fixed PULM:  Mechanical breath sounds GI: soft, Extremities: warm/dry, no edema  Skin: no rashes or lesions   Resolved Hospital Problem list     Assessment & Plan:    Fulminant  liver failure secondary to Acetaminophen overdose and likely alcoholic hepatitis resulting in refractory acidosis and shock AST >10k, ALT 2k -course as above, despite maximal vasopressors, vent support, CRRT, bicarb, NAC and volume replacement, patient had a cardiac arrest and worsening multi-organ failure.  Pt's niece transitioned to comfort care and was able to be at the bedside at the time of patient's passing at 2049 on 12/21     Severe, acute pancreatitis  Significantly elevated Lipase and inflammation on CT without evidence of necrosis or abscess, worsening hypotension in the ED requiring peripheral pressors.  Likely alcohol induced     AGMA Likely secondary to severe lactic acidosis and Tylenol OD    Alcohol Abuse 4-5 glasses of wine at baseline, unsure when last drink was    Renal insufficiency Creatinine intermittently elevated over last admissions, currently making urine.  Has baseline elevated transaminases, however significantly worsened on admission    HTN, HL - home Norvasc, Diovan, Crestor were held in the setting of the above        Goals of Care:  Last date of multidisciplinary goals of care discussion: 12/21 Family and staff present: Pt's niece, myself and RN Summary of discussion: Transitioned to comfort care Follow up goals of care discussion due:  Code Status: DNR  Labs   CBC: Recent Labs  Lab 07-27-2020 0850 06/28/2020 0127 07/06/2020 0133 06/12/2020 0209 06/16/2020 1118 06/16/2020 1746 07/01/2020 1815  WBC 11.7*  --   --  14.9*  --   --  5.9  HGB 13.5   < > 14.3 12.5 13.9 12.2 10.6*  HCT 41.7   < > 42.0 40.8 41.0 36.0 36.0  MCV 97.9  --   --  107.4*  --   --  106.5*  PLT 329  --   --  240  --   --  165   < > = values in this interval not displayed.    Basic Metabolic Panel: Recent Labs  Lab 07-27-20 0850 06/20/2020 0127 07/09/2020 0349 06/13/2020 1053 07/03/2020 1118 06/17/2020 1550 06/21/2020 1746 06/26/2020 1815  NA 130*   < > 128* 127* 126* 131*  126* 128*  K 3.1*   < > 7.1* 4.3 4.4 4.5 4.0 4.2  CL 89*  --  89* 92*  --  91*  --  89*  CO2 13*  --  RESULTS UNAVAILABLE DUE TO INTERFERING SUBSTANCE <7*  --  10*  --  8*  GLUCOSE 96  --  54* 84  --  92  --  171*  BUN 12  --  9 11  --  11  --  11  CREATININE 1.61*  --  1.88* 2.22*  --  2.43*  --  2.42*  CALCIUM 9.1  --  7.7* 7.5*  --  7.6*  --  6.9*  MG  --   --  2.1  --   --   --   --  1.8  PHOS  --   --  8.7*  --   --  9.3*  --   --    < > = values in this interval not displayed.   GFR: Estimated Creatinine Clearance: 29.9 mL/min (A) (by C-G formula based on SCr of 2.42 mg/dL (H)). Recent  Labs  Lab 06/21/2020 0850 06/27/2020 1232 06/18/2020 2248 Jul 08, 2020 0030 08-Jul-2020 0209 07/08/2020 1053 07/08/20 1815  WBC 11.7*  --   --   --  14.9*  --  5.9  LATICACIDVEN  --  >11.0* >11.0* >11.0*  --  >11.0*  --     Liver Function Tests: Recent Labs  Lab 06/16/2020 0850 07-08-20 0349 2020-07-08 1053 Jul 08, 2020 1550 08-Jul-2020 1815  AST 2,072* RESULTS UNAVAILABLE DUE TO INTERFERING SUBSTANCE >10,000*  --  >10,000*  ALT 811* 1,551* 1,936*  --  2,052*  ALKPHOS 99 176* 188*  --  154*  BILITOT 4.7* 5.5* 5.0*  --  4.5*  PROT 7.1 5.5* 5.3*  --  5.0*  ALBUMIN 3.7 2.9* 2.8* 2.6* 2.8*   Recent Labs  Lab 06/19/2020 0850  LIPASE 1,135*   No results for input(s): AMMONIA in the last 168 hours.  ABG    Component Value Date/Time   PHART 6.940 (LL) 07/08/2020 1746   PCO2ART 44.9 Jul 08, 2020 1746   PO2ART 95 08-Jul-2020 1746   HCO3 10.1 (L) 07/08/20 1746   TCO2 12 (L) Jul 08, 2020 1746   ACIDBASEDEF 22.0 (H) 07/08/20 1746   O2SAT 93.0 2020/07/08 1746     Coagulation Profile: Recent Labs  Lab 07/02/2020 1650 07/08/20 1815  INR 4.0* >10.0*    Cardiac Enzymes: No results for input(s): CKTOTAL, CKMB, CKMBINDEX, TROPONINI in the last 168 hours.  HbA1C: Hgb A1c MFr Bld  Date/Time Value Ref Range Status  08/08/2018 03:53 PM 5.1 4.8 - 5.6 % Final    Comment:             Prediabetes: 5.7 - 6.4           Diabetes: >6.4          Glycemic control for adults with diabetes: <7.0     CBG: Recent Labs  Lab July 08, 2020 0304 08-Jul-2020 0343 July 08, 2020 0717 Jul 08, 2020 1212 July 08, 2020 1759  GLUCAP <10* 147* 109* 69* 139*    Review of Systems:   Negative except as noted in HPI  Past Medical History:  She,  has a past medical history of HSV-2 (herpes simplex virus 2) infection, Hypertension, Peripheral neuropathy (11/22/2018), Right carpal tunnel syndrome (11/22/2018), and STD (sexually transmitted disease).   Surgical History:   Past Surgical History:  Procedure Laterality Date   FINGER SURGERY     partial amputation     Social History:   reports that she has been smoking. She has never used smokeless tobacco. She reports current alcohol use. She reports previous drug use.   Family History:  Her family history includes Heart disease in her mother; Hypertension in her father, mother, sister, and sister.   Allergies No Known Allergies   Home Medications  Prior to Admission medications   Medication Sig Start Date End Date Taking? Authorizing Provider  amLODipine (NORVASC) 5 MG tablet Take 1 tablet (5 mg total) by mouth daily. 03/20/20  Yes Arvilla Market, DO  aspirin EC 81 MG tablet Take 81 mg by mouth daily. Swallow whole.   Yes [provider]  Cholecalciferol (VITAMIN D-3) 25 MCG (1000 UT) CAPS Take 1,000 Units by mouth daily.   Yes [provider]  gabapentin (NEURONTIN) 600 MG tablet Take 1 tablet (600 mg total) by mouth 4 (four) times daily. 03/25/20  Yes Glean Salvo, NP  Multiple Vitamins-Minerals (ONE-A-DAY WOMENS 50 PLUS PO) Take 1 tablet by mouth daily.   Yes [provider]  Omega-3 Fatty Acids (FISH OIL) 1000 MG CAPS  Take 1,000 mg by mouth daily.   Yes [provider]  omeprazole (PRILOSEC) 20 MG capsule Take 1 capsule (20 mg total) by mouth daily. 02/20/20  Yes Arvilla Market, DO  potassium chloride SA (KLOR-CON) 20  MEQ tablet Take 2 tablets (40 mEq total) by mouth daily. 04/25/20  Yes Fondaw, Wylder S, PA  rosuvastatin (CRESTOR) 10 MG tablet Take 1 tablet (10 mg total) by mouth daily. 02/20/20  Yes Arvilla Market, DO  valsartan-hydrochlorothiazide (DIOVAN-HCT) 320-25 MG tablet Take 1 tablet by mouth daily. 03/20/20  Yes Arvilla Market, DO  cetirizine-pseudoephedrine (ZYRTEC-D) 5-120 MG tablet Take 1 tablet by mouth daily. Patient not taking: Reported on July 10, 2020 04/17/20   Moshe Cipro, NP  fluticasone [facility]) 50 MCG/ACT nasal spray Place 2 sprays into both nostrils daily. Patient not taking: Reported on 07-10-2020 04/17/20   Moshe Cipro, NP  ondansetron (ZOFRAN ODT) 4 MG disintegrating tablet Take 1 tablet (4 mg total) by mouth every 8 (eight) hours as needed for nausea or vomiting. Jul 10, 2020   Gwyneth Sprout, MD  oxyCODONE (ROXICODONE) 5 MG immediate release tablet Take 1 tablet (5 mg total) by mouth every 4 (four) hours as needed for severe pain. Jul 10, 2020   Gwyneth Sprout, MD  oxyCODONE-acetaminophen (PERCOCET) 5-325 MG tablet Take 1 tablet by mouth every 6 (six) hours as needed. 04/25/20   Gailen Shelter, PA     Critical care time: 30 minutes       Darcella Gasman Leontina Skidmore, PA-C  PCCM  Pager# [phone], if no answer [phone]

## 2020-07-11 NOTE — ED Notes (Addendum)
RN made aware of low CBG and of low blood pressure. Pt is alert.

## 2020-07-11 NOTE — Progress Notes (Signed)
eLink Physician-Brief Progress Note Patient Name: ALEANNA MENGE DOB: 01/24/1966 MRN: [id number]   Date of Service  06/28/2020  HPI/Events of Note  Hypotension - SBP = 60's. Currently on Norepinephrine IV infusion at 75 mcg.min. Last pH at 5:46 PM = 6.940. Currently on NaHCO3 IV infusion at 150 mL/hour. Prognosis is grim. She is failing in spite of maximal medical support.  eICU Interventions  Plan: 1. NaHCO3 200 meq IV now.  2. Increase PRVC rate to 32 (she starts to air trap when RR > 32). 3. Will ask ground team to evaluate at bedside.      Intervention Category Major Interventions: Hypotension - evaluation and management  Alto Gandolfo Dennard Nip 06/29/2020, 7:51 PM

## 2020-07-11 NOTE — Progress Notes (Signed)
I was informed by RN patient started the setting with O2 sat into the low 80s, patient was found awake but confused and with tachypnea with respirate and 30s.  Also she was noted to be hypotensive with systolic in 07O.  Spoke with patient's daughter and explained that patient is going into multiple organ failure, she needs to be intubated and needs to be started on continuous hemodialysis. Patient was started on Levophed via peripheral line to keep map around 65, she was placed on nasal cannula oxygen to improve oxygen saturation.  On lab work she was noted to be acidotic with serum bicarbonate less than 7, she was given 2 ampoules of bicarbonate push, started on IV bicarbonate infusion.  Also she was given 2 g of IV calcium gluconate With all that [facility] patient blood pressure improved with map of 67 and her O2 sat remained in low 80s so decision was made to intubate.  Patient was intubated using RSI kit with ketamine and rocuronium considering hypotension.  Post intubation left IJ central line was placed and right IJ hemodialysis catheter was placed.   Total critical care time: 63 minutes  Performed by: [place] care time was exclusive of separately billable procedures and treating other patients.   Critical care was necessary to treat or prevent imminent or life-threatening deterioration.   Critical care was time spent personally by me on the following activities: development of treatment plan with patient and/or surrogate as well as nursing, discussions with consultants, evaluation of patient's response to treatment, examination of patient, obtaining history from patient or surrogate, ordering and performing treatments and interventions, ordering and review of laboratory studies, ordering and review of radiographic studies, pulse oximetry and re-evaluation of patient's condition.   Jacky Kindle MD [facility] Pager: [phone] Mobile: [phone]

## 2020-07-11 NOTE — Progress Notes (Signed)
Pt lost pulse at 1844.  CPR and Code blue was immediately initiated Dr. Merrily Pew and Delorise Shiner NP promptly arrived at bedside when code blue was initiated. 1845-2 amps of NaHCO3 adminered 1845-1 mg of Epi adminstered 1851-2g of Calcium admistered 1853-ROSC achieved    See code blue sheet for additional details.    Erick Blinks, RN

## 2020-07-11 NOTE — Progress Notes (Signed)
   07/08/2020 1900  Clinical Encounter Type  Visited With Patient not available  Visit Type Code  Referral From Nurse  Consult/Referral To Chaplain  The chaplain responded to code blue. No family present at the time of code. Patient has been stabilized. No chaplain services needed. The chaplain will respond as needed.

## 2020-07-11 NOTE — ED Notes (Signed)
Pt given ice chips, ok by CCM NP

## 2020-07-11 NOTE — Procedures (Signed)
Central Venous Catheter Insertion Procedure Note  Rebecca Oconnor  [id number]  [birth year]  Date:06/19/2020  Time:2:29 PM   Provider Performing:Tamaka Sawin   Procedure: Insertion of Non-tunneled Central Venous Catheter(36556)with US guidance (52841)    Indication(s) Hemodialysis  Consent Risks of the procedure as well as the alternatives and risks of each were explained to the patient and/or caregiver.  Consent for the procedure was obtained and is signed in the bedside chart  Anesthesia Topical only with 1% lidocaine   Timeout Verified patient identification, verified procedure, site/side was marked, verified correct patient position, special equipment/implants available, medications/allergies/relevant history reviewed, required imaging and test results available.  Sterile Technique Maximal sterile technique including full sterile barrier drape, hand hygiene, sterile gown, sterile gloves, mask, hair covering, sterile ultrasound probe cover (if used).  Procedure Description Area of catheter insertion was cleaned with chlorhexidine and draped in sterile fashion.   With real-time ultrasound guidance a HD catheter was placed into the right internal jugular vein.  Nonpulsatile blood flow and easy flushing noted in all ports.  The catheter was sutured in place and sterile dressing applied.  Complications/Tolerance None; patient tolerated the procedure well. Chest X-ray is ordered to verify placement for internal jugular or subclavian cannulation.  Chest x-ray is not ordered for femoral cannulation.  EBL Minimal  Specimen(s) None

## 2020-07-11 NOTE — Progress Notes (Signed)
ABG results reported to S Minor NP

## 2020-07-11 NOTE — ED Notes (Signed)
Rectal temp 90.6, bair hugger applied, will page CCM to inform

## 2020-07-11 NOTE — Procedures (Signed)
Intubation Procedure Note  Rebecca Oconnor  [id number]  March 18, 1966  Date:07/08/2020  Time:2:27 PM   Provider Performing:Tayvion Lauder    Procedure: Intubation (31500)  Indication(s) Respiratory Failure  Consent Risks of the procedure as well as the alternatives and risks of each were explained to the patient and/or caregiver.  Consent for the procedure was obtained and is signed in the bedside chart   Anesthesia Rocuronium and Ketamine   Time Out Verified patient identification, verified procedure, site/side was marked, verified correct patient position, special equipment/implants available, medications/allergies/relevant history reviewed, required imaging and test results available.   Sterile Technique Usual hand hygeine, masks, and gloves were used   Procedure Description Patient positioned in bed supine.  Sedation given as noted above.  Patient was intubated with endotracheal tube using Glidescope.  View was Grade 1 full glottis .  Number of attempts was 1.  Colorimetric CO2 detector was consistent with tracheal placement.   Complications/Tolerance None; patient tolerated the procedure well. Chest X-ray is ordered to verify placement.   EBL Minimal   Specimen(s) None

## 2020-07-11 NOTE — Procedures (Signed)
Arterial Catheter Insertion Procedure Note  Rebecca Oconnor  [id number]  [birth year]  Date:07-08-2020  Time:5:45 PM    Provider Performing: Boris Lown  Rebecca Oconnor    Procedure: Insertion of Arterial Line (70786) without US guidance  Indication(s) Blood pressure monitoring and/or need for frequent ABGs  Consent Risks of the procedure as well as the alternatives and risks of each were explained to the patient and/or caregiver.  Consent for the procedure was obtained and is signed in the bedside chart  Anesthesia None   Time Out Verified patient identification, verified procedure, site/side was marked, verified correct patient position, special equipment/implants available, medications/allergies/relevant history reviewed, required imaging and test results available.   Sterile Technique Maximal sterile technique including full sterile barrier drape, hand hygiene, sterile gown, sterile gloves, mask, hair covering, sterile ultrasound probe cover (if used).   Procedure Description Area of catheter insertion was cleaned with chlorhexidine and draped in sterile fashion. Without real-time ultrasound guidance an arterial catheter was placed into the right radial artery.  Appropriate arterial tracings confirmed on monitor.     Complications/Tolerance None; patient tolerated the procedure well.   EBL Minimal   Specimen(s) None

## 2020-07-11 DEATH — deceased

## 2020-07-21 ENCOUNTER — Ambulatory Visit: Payer: No Typology Code available for payment source | Admitting: Certified Nurse Midwife

## 2021-09-03 IMAGING — CT CT CHEST W/O CM
2 of 5 series · 14 of 46 positions shown, 16 images · non-contrast
Comparison: None.

CLINICAL DATA: Ongoing nausea, vomiting and diarrhea. Diagnosed
with [id number] on 04/17/2020.

EXAM:
CT CHEST, ABDOMEN AND PELVIS WITHOUT CONTRAST
TECHNIQUE: Multidetector CT imaging of the chest, abdomen and pelvis was
performed following the standard protocol without IV contrast.

[Series 5: cap w/o 2.0 mm st · axial · non-contrast · 0.82mm/px · z∈[-565,+47]mm · 11 of 350 slices shown, 13 images]
[im 22/350  soft-tissue]
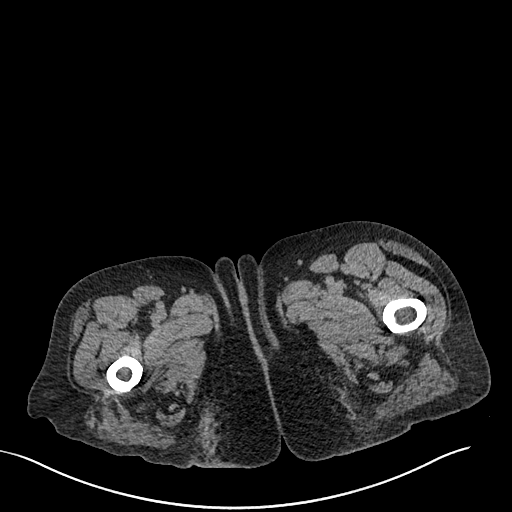
[im 22/350  bone]
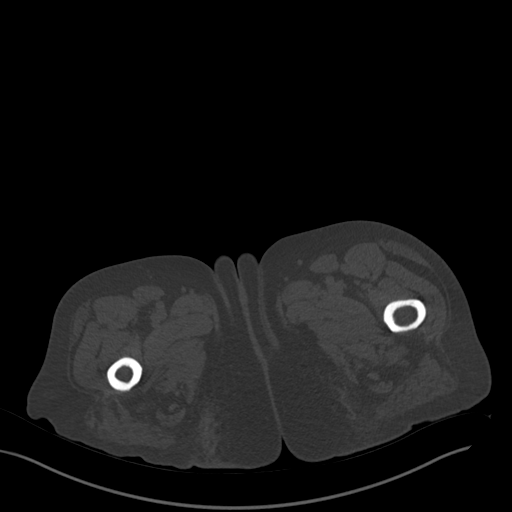
[im 66/350  soft-tissue]
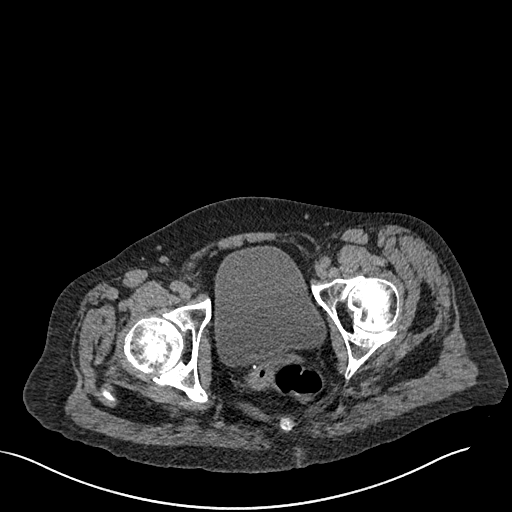
[im 88/350  soft-tissue]
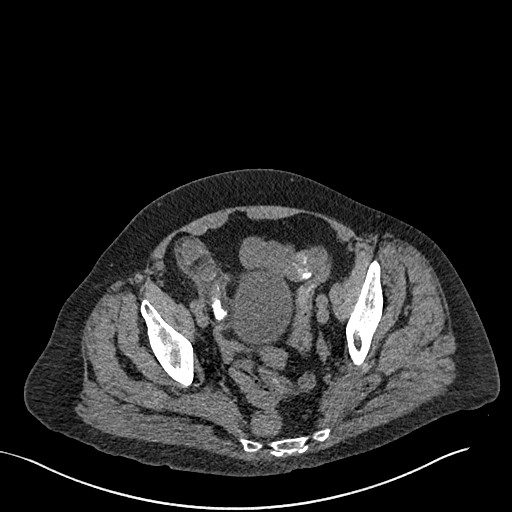
[im 110/350  soft-tissue]
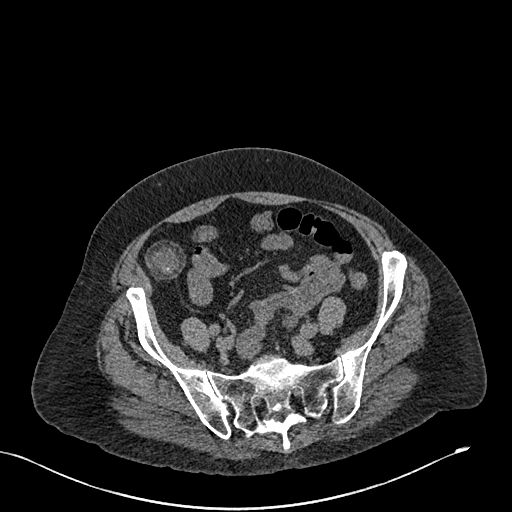
[im 153/350  soft-tissue]
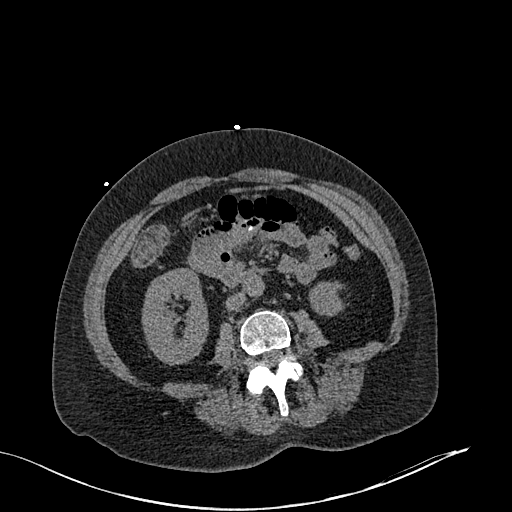
[im 175/350  soft-tissue]
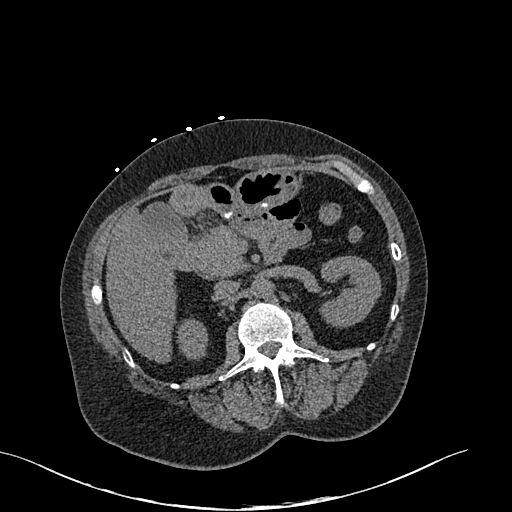
[im 197/350  soft-tissue]
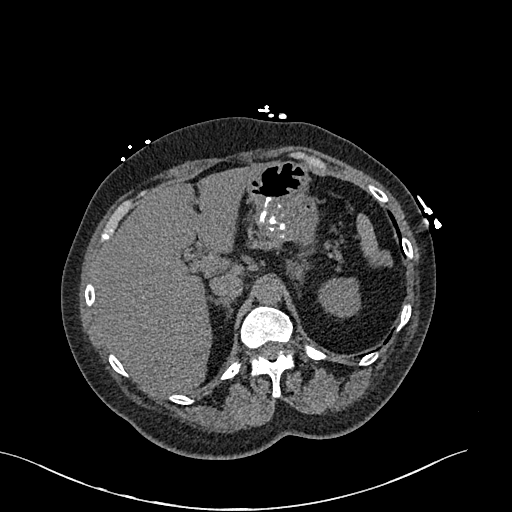
[im 240/350  soft-tissue]
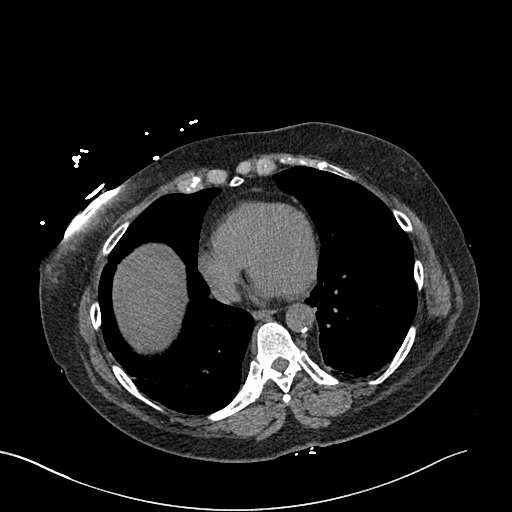
[im 262/350  soft-tissue]
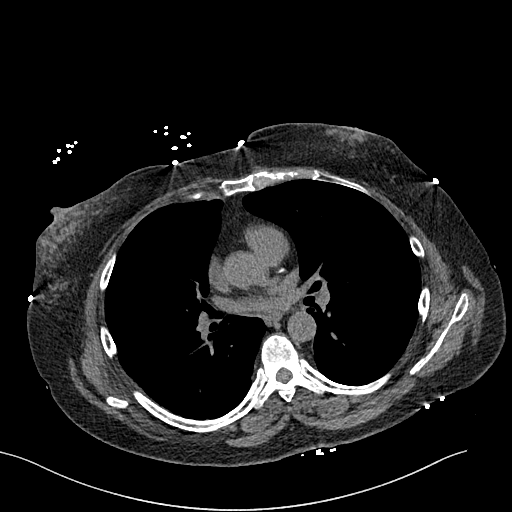
[im 262/350  bone]
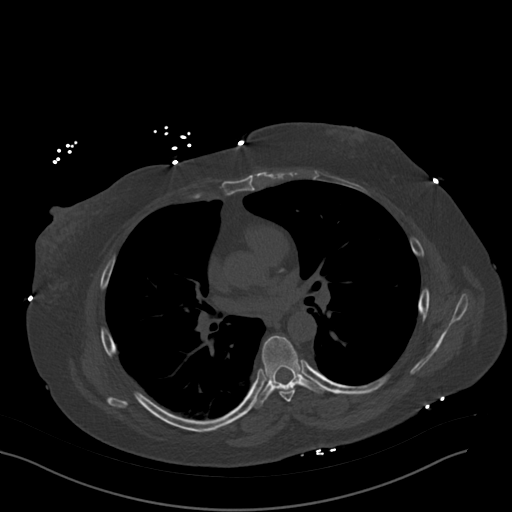
[im 284/350  soft-tissue]
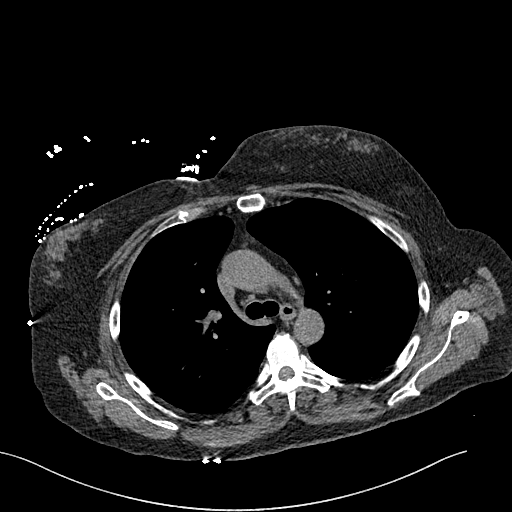
[im 328/350  soft-tissue]
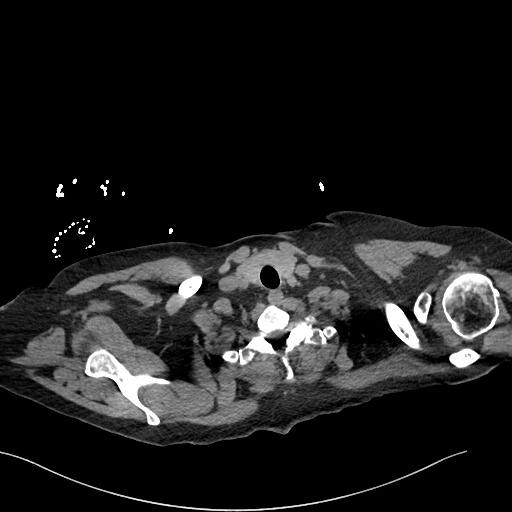

[Series 7: cap w/o 3.0 mm st cor · coronal · non-contrast · 0.85mm/px · 3 of 109 slices shown]
[im 37/109  soft-tissue]
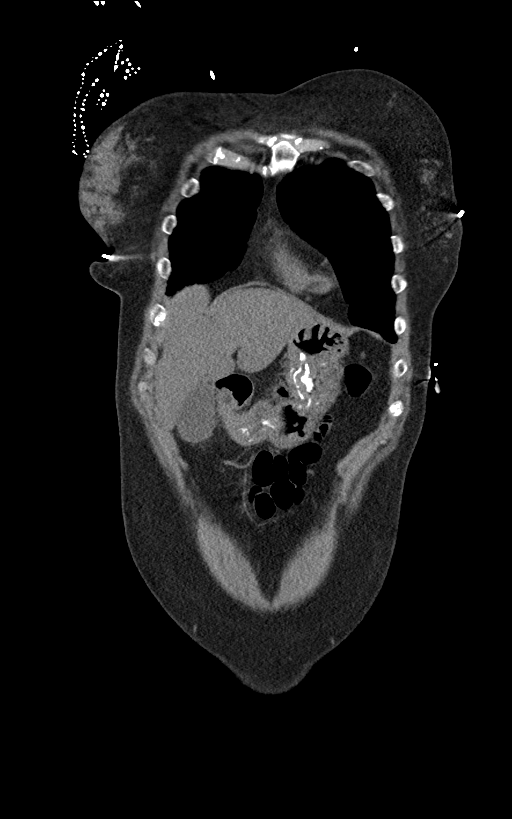
[im 49/109  soft-tissue]
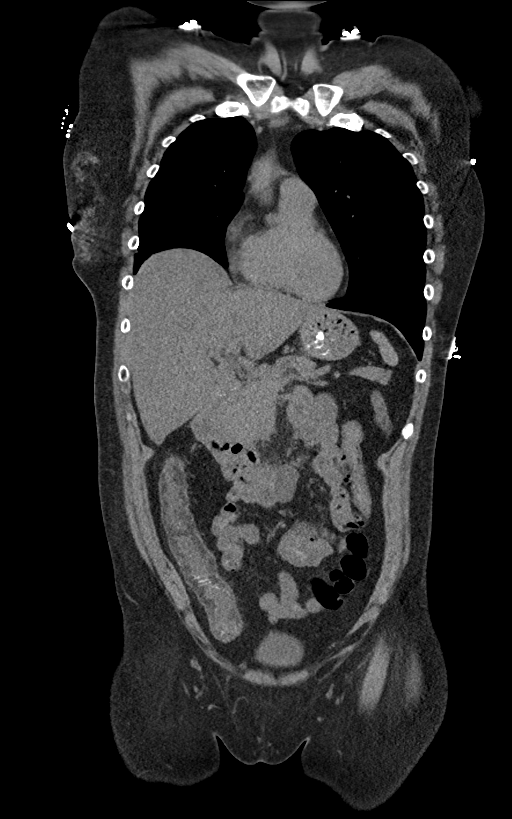
[im 61/109  soft-tissue]
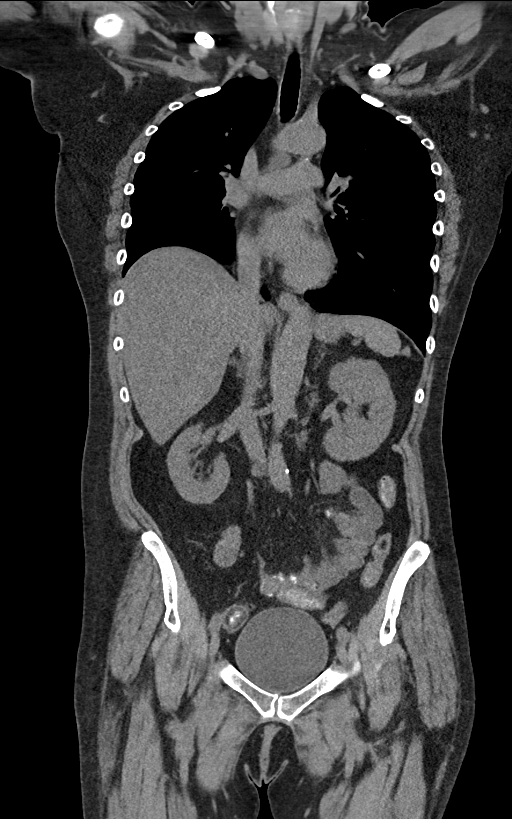

[14 of 46 positions shown; findings below may reference images not displayed]

FINDINGS: CT CHEST FINDINGS

Cardiovascular: Heart normal in size and configuration. No
pericardial effusion. Left coronary artery calcifications. Great
vessels are normal in caliber. Minor aortic atherosclerotic
calcifications.

Mediastinum/Nodes: No enlarged mediastinal, hilar, or axillary lymph
nodes. Thyroid gland, trachea, and esophagus demonstrate no
significant findings.

Lungs/Pleura: Mild linear opacities in the dependent lower lobes,
greater on the right, consistent with atelectasis. Mild linear
atelectasis the right middle lobe. Small chronic areas of scarring
and bronchiectasis in the right upper lobe near the apex. No
evidence of pneumonia or pulmonary edema. No lung mass or nodule. No
pleural effusion or pneumothorax.

Musculoskeletal: No fracture or acute finding.  No bone lesion.

CT ABDOMEN PELVIS FINDINGS

Hepatobiliary: There are subtle hypoattenuating lesions within the
liver, largest along the inferior margin segment 3 measuring 1.4 cm.
These are incompletely characterized, but are higher in attenuation
and not as defined as expected for cysts. Liver is normal in size
with no other lesions. Gallbladder is unremarkable. No bile duct
dilation.

Pancreas: There is fat stranding adjacent to an anterior to
pancreatic head and second portion of the duodenum. Remainder of the
pancreas is unremarkable. No masses.

Spleen: Normal in size without focal abnormality.

Adrenals/Urinary Tract: Mild left adrenal gland thickening
suggesting hyperplasia. No defined nodule. Normal right adrenal
gland. Kidneys normal in size, orientation and position. No renal
masses, stones or hydronephrosis. Normal ureters. Normal bladder.

Stomach/Bowel: Normal stomach. Small bowel is normal in caliber. No
wall thickening or inflammation. There is mild thickening the wall
the right colon with low attenuation in the wall consistent with
fatty replacement. This is presumed chronic may be related to remote
bowel inflammation. No convincing active colonic inflammation.
Normal appendix visualized.

Vascular/Lymphatic: Mild aortic atherosclerosis. No enlarged lymph
nodes.

Reproductive: Uterus and bilateral adnexa are unremarkable.

Other: No abdominal wall hernia or abnormality. No abdominopelvic
ascites.

Musculoskeletal: No fracture or acute finding.  No bone lesion.
IMPRESSION: 1. Mild inflammatory changes lie along the anterior margin of the
pancreatic head and adjacent second portion of the duodenum. This
may be due to pancreatitis or duodenitis with former suspected. No
evidence a collection to suggest an abscess or pseudocyst.
2. No other acute abnormality within the chest, abdomen or pelvis.
3. Vague low-density liver lesions, largest 1.4 cm. These would be
better assessed with a contrast enhanced study or preferably liver
MRI without and with contrast.
